# Patient Record
Sex: Female | Born: 1985 | Race: White | Hispanic: No | Smoking: Light tobacco smoker
Health system: Southern US, Community
[De-identification: ages and names within clinical notes are randomized; demographics above are authoritative.]

## PROBLEM LIST (undated history)

## (undated) DIAGNOSIS — Z789 Other specified health status: Secondary | ICD-10-CM

## (undated) DIAGNOSIS — N921 Excessive and frequent menstruation with irregular cycle: Secondary | ICD-10-CM

## (undated) DIAGNOSIS — R87629 Unspecified abnormal cytological findings in specimens from vagina: Secondary | ICD-10-CM

## (undated) DIAGNOSIS — R102 Pelvic and perineal pain: Secondary | ICD-10-CM

## (undated) DIAGNOSIS — Q615 Medullary cystic kidney: Secondary | ICD-10-CM

## (undated) DIAGNOSIS — N200 Calculus of kidney: Secondary | ICD-10-CM

## (undated) DIAGNOSIS — Z309 Encounter for contraceptive management, unspecified: Secondary | ICD-10-CM

## (undated) DIAGNOSIS — B977 Papillomavirus as the cause of diseases classified elsewhere: Secondary | ICD-10-CM

## (undated) DIAGNOSIS — I10 Essential (primary) hypertension: Secondary | ICD-10-CM

## (undated) DIAGNOSIS — Z8619 Personal history of other infectious and parasitic diseases: Secondary | ICD-10-CM

## (undated) DIAGNOSIS — G43909 Migraine, unspecified, not intractable, without status migrainosus: Secondary | ICD-10-CM

## (undated) DIAGNOSIS — K5900 Constipation, unspecified: Secondary | ICD-10-CM

## (undated) HISTORY — DX: Calculus of kidney: N20.0

## (undated) HISTORY — DX: Other specified health status: Z78.9

## (undated) HISTORY — DX: Personal history of other infectious and parasitic diseases: Z86.19

## (undated) HISTORY — DX: Pelvic and perineal pain: R10.2

## (undated) HISTORY — DX: Migraine, unspecified, not intractable, without status migrainosus: G43.909

## (undated) HISTORY — DX: Constipation, unspecified: K59.00

## (undated) HISTORY — DX: Unspecified abnormal cytological findings in specimens from vagina: R87.629

## (undated) HISTORY — DX: Excessive and frequent menstruation with irregular cycle: N92.1

## (undated) HISTORY — DX: Encounter for contraceptive management, unspecified: Z30.9

## (undated) HISTORY — DX: Papillomavirus as the cause of diseases classified elsewhere: B97.7

## (undated) HISTORY — DX: Medullary cystic kidney: Q61.5

## (undated) HISTORY — PX: APPENDECTOMY: SHX54

---
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
## 2005-02-21 ENCOUNTER — Inpatient Hospital Stay (HOSPITAL_COMMUNITY): Admission: RE | Admit: 2005-02-21 | Discharge: 2005-02-24 | Payer: Self-pay | Admitting: Psychiatry

## 2005-02-21 ENCOUNTER — Ambulatory Visit: Payer: Self-pay | Admitting: Psychiatry

## 2005-04-26 ENCOUNTER — Other Ambulatory Visit: Admission: RE | Admit: 2005-04-26 | Discharge: 2005-04-26 | Payer: Self-pay | Admitting: Gynecology

## 2006-03-22 ENCOUNTER — Other Ambulatory Visit: Admission: RE | Admit: 2006-03-22 | Discharge: 2006-03-22 | Payer: Self-pay | Admitting: Gynecology

## 2006-06-16 ENCOUNTER — Emergency Department (HOSPITAL_COMMUNITY): Admission: EM | Admit: 2006-06-16 | Discharge: 2006-06-16 | Payer: Self-pay | Admitting: Emergency Medicine

## 2006-10-07 ENCOUNTER — Other Ambulatory Visit: Admission: RE | Admit: 2006-10-07 | Discharge: 2006-10-07 | Payer: Self-pay | Admitting: Gynecology

## 2007-01-30 ENCOUNTER — Emergency Department (HOSPITAL_COMMUNITY): Admission: EM | Admit: 2007-01-30 | Discharge: 2007-01-30 | Payer: Self-pay | Admitting: Emergency Medicine

## 2009-01-14 ENCOUNTER — Inpatient Hospital Stay (HOSPITAL_COMMUNITY): Admission: AD | Admit: 2009-01-14 | Discharge: 2009-01-20 | Payer: Self-pay | Admitting: Obstetrics

## 2009-11-24 ENCOUNTER — Other Ambulatory Visit: Admission: RE | Admit: 2009-11-24 | Discharge: 2009-11-24 | Payer: Self-pay | Admitting: Obstetrics and Gynecology

## 2010-01-03 ENCOUNTER — Emergency Department (HOSPITAL_COMMUNITY): Admission: EM | Admit: 2010-01-03 | Discharge: 2010-01-03 | Payer: Self-pay | Admitting: Emergency Medicine

## 2010-03-27 ENCOUNTER — Inpatient Hospital Stay (HOSPITAL_COMMUNITY)
Admission: AD | Admit: 2010-03-27 | Discharge: 2010-03-31 | Payer: Self-pay | Source: Home / Self Care | Attending: Obstetrics & Gynecology | Admitting: Obstetrics & Gynecology

## 2010-03-31 ENCOUNTER — Ambulatory Visit (HOSPITAL_COMMUNITY): Admission: RE | Admit: 2010-03-31 | Payer: Self-pay | Source: Home / Self Care | Admitting: Obstetrics and Gynecology

## 2010-06-26 LAB — CBC
HCT: 26.4 % — ABNORMAL LOW (ref 36.0–46.0)
Hemoglobin: 8.4 g/dL — ABNORMAL LOW (ref 12.0–15.0)
MCH: 26.8 pg (ref 26.0–34.0)
MCHC: 31.8 g/dL (ref 30.0–36.0)
MCV: 84.3 fL (ref 78.0–100.0)
Platelets: 246 10*3/uL (ref 150–400)
RBC: 3.13 MIL/uL — ABNORMAL LOW (ref 3.87–5.11)
RDW: 13.5 % (ref 11.5–15.5)
WBC: 9.6 10*3/uL (ref 4.0–10.5)

## 2010-06-26 LAB — COMPREHENSIVE METABOLIC PANEL
ALT: 10 U/L (ref 0–35)
AST: 18 U/L (ref 0–37)
Albumin: 2.6 g/dL — ABNORMAL LOW (ref 3.5–5.2)
Alkaline Phosphatase: 106 U/L (ref 39–117)
BUN: 6 mg/dL (ref 6–23)
CO2: 27 mEq/L (ref 19–32)
Calcium: 8.6 mg/dL (ref 8.4–10.5)
Chloride: 104 mEq/L (ref 96–112)
Creatinine, Ser: 0.71 mg/dL (ref 0.4–1.2)
GFR calc Af Amer: >60 mL/min (ref >60)
GFR calc non Af Amer: >60 mL/min (ref >60)
Glucose, Bld: 81 mg/dL (ref 70–99)
Potassium: 4.4 mEq/L (ref 3.5–5.1)
Sodium: 138 mEq/L (ref 135–145)
Total Bilirubin: 0.6 mg/dL (ref 0.3–1.2)
Total Protein: 5.7 g/dL — ABNORMAL LOW (ref 6.0–8.3)

## 2010-06-26 LAB — URINALYSIS, MICROSCOPIC ONLY
Bilirubin Urine: NEGATIVE
Glucose, UA: NEGATIVE mg/dL
Ketones, ur: NEGATIVE mg/dL
Leukocytes, UA: NEGATIVE
Nitrite: NEGATIVE
Protein, ur: NEGATIVE mg/dL
Specific Gravity, Urine: 1.02 (ref 1.005–1.030)
Urobilinogen, UA: 1 mg/dL (ref 0.0–1.0)
pH: 7 (ref 5.0–8.0)

## 2010-06-26 LAB — PROTEIN / CREATININE RATIO, URINE
Creatinine, Urine: 175.8 mg/dL
Protein Creatinine Ratio: 0.13 (ref 0.00–0.15)
Total Protein, Urine: 22 mg/dL

## 2010-06-26 LAB — RAPID URINE DRUG SCREEN, HOSP PERFORMED
Amphetamines: NOT DETECTED
Barbiturates: NOT DETECTED
Benzodiazepines: NOT DETECTED
Cocaine: NOT DETECTED
Opiates: POSITIVE — AB
Tetrahydrocannabinol: NOT DETECTED

## 2010-06-27 LAB — CBC
HCT: 23.8 % — ABNORMAL LOW (ref 36.0–46.0)
HCT: 29.3 % — ABNORMAL LOW (ref 36.0–46.0)
Hemoglobin: 7.9 g/dL — ABNORMAL LOW (ref 12.0–15.0)
Hemoglobin: 9.7 g/dL — ABNORMAL LOW (ref 12.0–15.0)
MCH: 28.3 pg (ref 26.0–34.0)
MCH: 28.4 pg (ref 26.0–34.0)
MCHC: 33.1 g/dL (ref 30.0–36.0)
MCHC: 33.2 g/dL (ref 30.0–36.0)
MCV: 85.5 fL (ref 78.0–100.0)
MCV: 85.7 fL (ref 78.0–100.0)
Platelets: 197 10*3/uL (ref 150–400)
Platelets: 231 10*3/uL (ref 150–400)
RBC: 2.78 MIL/uL — ABNORMAL LOW (ref 3.87–5.11)
RBC: 3.43 MIL/uL — ABNORMAL LOW (ref 3.87–5.11)
RDW: 13.1 % (ref 11.5–15.5)
RDW: 13.5 % (ref 11.5–15.5)
WBC: 10.1 10*3/uL (ref 4.0–10.5)
WBC: 12 10*3/uL — ABNORMAL HIGH (ref 4.0–10.5)

## 2010-06-27 LAB — URINALYSIS, ROUTINE W REFLEX MICROSCOPIC
Bilirubin Urine: NEGATIVE
Glucose, UA: NEGATIVE mg/dL
Hgb urine dipstick: NEGATIVE
Ketones, ur: 15 mg/dL — AB
Nitrite: NEGATIVE
Protein, ur: NEGATIVE mg/dL
Specific Gravity, Urine: 1.015 (ref 1.005–1.030)
Urobilinogen, UA: 1 mg/dL (ref 0.0–1.0)
pH: 6 (ref 5.0–8.0)

## 2010-06-27 LAB — COMPREHENSIVE METABOLIC PANEL
ALT: 8 U/L (ref 0–35)
AST: 16 U/L (ref 0–37)
Albumin: 2.8 g/dL — ABNORMAL LOW (ref 3.5–5.2)
Alkaline Phosphatase: 124 U/L — ABNORMAL HIGH (ref 39–117)
BUN: 3 mg/dL — ABNORMAL LOW (ref 6–23)
CO2: 24 mEq/L (ref 19–32)
Calcium: 9 mg/dL (ref 8.4–10.5)
Chloride: 104 mEq/L (ref 96–112)
Creatinine, Ser: 0.65 mg/dL (ref 0.4–1.2)
GFR calc Af Amer: >60 mL/min (ref >60)
GFR calc non Af Amer: >60 mL/min (ref >60)
Glucose, Bld: 80 mg/dL (ref 70–99)
Potassium: 3.6 mEq/L (ref 3.5–5.1)
Sodium: 137 mEq/L (ref 135–145)
Total Bilirubin: 0.5 mg/dL (ref 0.3–1.2)
Total Protein: 5.5 g/dL — ABNORMAL LOW (ref 6.0–8.3)

## 2010-06-27 LAB — RAPID URINE DRUG SCREEN, HOSP PERFORMED
Amphetamines: NOT DETECTED
Barbiturates: NOT DETECTED
Benzodiazepines: NOT DETECTED
Cocaine: NOT DETECTED
Opiates: NOT DETECTED
Tetrahydrocannabinol: NOT DETECTED

## 2010-06-27 LAB — RPR: RPR Ser Ql: NONREACTIVE

## 2010-06-27 LAB — PROTEIN / CREATININE RATIO, URINE
Creatinine, Urine: 131.8 mg/dL
Protein Creatinine Ratio: 0.2 — ABNORMAL HIGH (ref 0.00–0.15)
Total Protein, Urine: 26 mg/dL

## 2010-06-27 LAB — URIC ACID: Uric Acid, Serum: 4.8 mg/dL (ref 2.4–7.0)

## 2010-07-20 LAB — COMPREHENSIVE METABOLIC PANEL
ALT: 12 U/L (ref 0–35)
ALT: 12 U/L (ref 0–35)
ALT: 12 U/L (ref 0–35)
ALT: 29 U/L (ref 0–35)
ALT: 37 U/L — ABNORMAL HIGH (ref 0–35)
ALT: 38 U/L — ABNORMAL HIGH (ref 0–35)
AST: 20 U/L (ref 0–37)
AST: 23 U/L (ref 0–37)
AST: 37 U/L (ref 0–37)
AST: 44 U/L — ABNORMAL HIGH (ref 0–37)
Albumin: 2.3 g/dL — ABNORMAL LOW (ref 3.5–5.2)
Albumin: 2.4 g/dL — ABNORMAL LOW (ref 3.5–5.2)
Albumin: 2.6 g/dL — ABNORMAL LOW (ref 3.5–5.2)
Albumin: 2.7 g/dL — ABNORMAL LOW (ref 3.5–5.2)
Alkaline Phosphatase: 105 U/L (ref 39–117)
Alkaline Phosphatase: 119 U/L — ABNORMAL HIGH (ref 39–117)
Alkaline Phosphatase: 145 U/L — ABNORMAL HIGH (ref 39–117)
Alkaline Phosphatase: 92 U/L (ref 39–117)
Alkaline Phosphatase: 99 U/L (ref 39–117)
BUN: 4 mg/dL — ABNORMAL LOW (ref 6–23)
BUN: <1 mg/dL — ABNORMAL LOW (ref 6–23)
BUN: <1 mg/dL — ABNORMAL LOW (ref 6–23)
CO2: 25 mEq/L (ref 19–32)
CO2: 26 mEq/L (ref 19–32)
CO2: 28 mEq/L (ref 19–32)
CO2: 29 mEq/L (ref 19–32)
CO2: 31 mEq/L (ref 19–32)
CO2: 32 mEq/L (ref 19–32)
Calcium: 8.2 mg/dL — ABNORMAL LOW (ref 8.4–10.5)
Calcium: 8.3 mg/dL — ABNORMAL LOW (ref 8.4–10.5)
Calcium: 8.4 mg/dL (ref 8.4–10.5)
Calcium: 8.4 mg/dL (ref 8.4–10.5)
Calcium: 8.5 mg/dL (ref 8.4–10.5)
Calcium: 9 mg/dL (ref 8.4–10.5)
Chloride: 104 mEq/L (ref 96–112)
Chloride: 105 mEq/L (ref 96–112)
Chloride: 108 mEq/L (ref 96–112)
Creatinine, Ser: 0.48 mg/dL (ref 0.4–1.2)
Creatinine, Ser: 0.51 mg/dL (ref 0.4–1.2)
Creatinine, Ser: 0.58 mg/dL (ref 0.4–1.2)
Creatinine, Ser: 0.61 mg/dL (ref 0.4–1.2)
Creatinine, Ser: 0.66 mg/dL (ref 0.4–1.2)
GFR calc Af Amer: >60 mL/min (ref >60)
GFR calc Af Amer: >60 mL/min (ref >60)
GFR calc Af Amer: >60 mL/min (ref >60)
GFR calc Af Amer: >60 mL/min (ref >60)
GFR calc Af Amer: >60 mL/min (ref >60)
GFR calc non Af Amer: >60 mL/min (ref >60)
GFR calc non Af Amer: >60 mL/min (ref >60)
GFR calc non Af Amer: >60 mL/min (ref >60)
GFR calc non Af Amer: >60 mL/min (ref >60)
GFR calc non Af Amer: >60 mL/min (ref >60)
GFR calc non Af Amer: >60 mL/min (ref >60)
Glucose, Bld: 73 mg/dL (ref 70–99)
Glucose, Bld: 79 mg/dL (ref 70–99)
Glucose, Bld: 80 mg/dL (ref 70–99)
Glucose, Bld: 89 mg/dL (ref 70–99)
Glucose, Bld: 97 mg/dL (ref 70–99)
Potassium: 2.5 mEq/L — CL (ref 3.5–5.1)
Potassium: 2.6 mEq/L — CL (ref 3.5–5.1)
Potassium: 3 mEq/L — ABNORMAL LOW (ref 3.5–5.1)
Potassium: 3 mEq/L — ABNORMAL LOW (ref 3.5–5.1)
Potassium: 3.7 mEq/L (ref 3.5–5.1)
Sodium: 137 mEq/L (ref 135–145)
Sodium: 137 mEq/L (ref 135–145)
Sodium: 138 mEq/L (ref 135–145)
Sodium: 139 mEq/L (ref 135–145)
Sodium: 139 mEq/L (ref 135–145)
Sodium: 140 mEq/L (ref 135–145)
Total Bilirubin: 0.6 mg/dL (ref 0.3–1.2)
Total Bilirubin: 0.6 mg/dL (ref 0.3–1.2)
Total Bilirubin: 0.7 mg/dL (ref 0.3–1.2)
Total Bilirubin: 0.7 mg/dL (ref 0.3–1.2)
Total Protein: 3.9 g/dL — ABNORMAL LOW (ref 6.0–8.3)
Total Protein: 4.6 g/dL — ABNORMAL LOW (ref 6.0–8.3)
Total Protein: 5.1 g/dL — ABNORMAL LOW (ref 6.0–8.3)
Total Protein: 5.5 g/dL — ABNORMAL LOW (ref 6.0–8.3)

## 2010-07-20 LAB — CBC
HCT: 26.4 % — ABNORMAL LOW (ref 36.0–46.0)
HCT: 30.6 % — ABNORMAL LOW (ref 36.0–46.0)
HCT: 34.5 % — ABNORMAL LOW (ref 36.0–46.0)
Hemoglobin: 10 g/dL — ABNORMAL LOW (ref 12.0–15.0)
Hemoglobin: 10.4 g/dL — ABNORMAL LOW (ref 12.0–15.0)
Hemoglobin: 11.4 g/dL — ABNORMAL LOW (ref 12.0–15.0)
Hemoglobin: 8.6 g/dL — ABNORMAL LOW (ref 12.0–15.0)
Hemoglobin: 8.7 g/dL — ABNORMAL LOW (ref 12.0–15.0)
Hemoglobin: 9.2 g/dL — ABNORMAL LOW (ref 12.0–15.0)
MCHC: 33 g/dL (ref 30.0–36.0)
MCHC: 33 g/dL (ref 30.0–36.0)
MCHC: 33.3 g/dL (ref 30.0–36.0)
MCHC: 33.5 g/dL (ref 30.0–36.0)
MCHC: 33.8 g/dL (ref 30.0–36.0)
MCV: 87.8 fL (ref 78.0–100.0)
MCV: 88.7 fL (ref 78.0–100.0)
MCV: 88.7 fL (ref 78.0–100.0)
MCV: 89.7 fL (ref 78.0–100.0)
MCV: 89.9 fL (ref 78.0–100.0)
Platelets: 227 10*3/uL (ref 150–400)
Platelets: 246 10*3/uL (ref 150–400)
Platelets: 249 10*3/uL (ref 150–400)
RBC: 2.88 MIL/uL — ABNORMAL LOW (ref 3.87–5.11)
RBC: 2.94 MIL/uL — ABNORMAL LOW (ref 3.87–5.11)
RBC: 3.11 MIL/uL — ABNORMAL LOW (ref 3.87–5.11)
RBC: 3.35 MIL/uL — ABNORMAL LOW (ref 3.87–5.11)
RBC: 3.48 MIL/uL — ABNORMAL LOW (ref 3.87–5.11)
RBC: 3.89 MIL/uL (ref 3.87–5.11)
RDW: 14.2 % (ref 11.5–15.5)
RDW: 14.3 % (ref 11.5–15.5)
RDW: 14.3 % (ref 11.5–15.5)
RDW: 14.5 % (ref 11.5–15.5)
WBC: 10.1 10*3/uL (ref 4.0–10.5)
WBC: 10.2 10*3/uL (ref 4.0–10.5)
WBC: 20.1 10*3/uL — ABNORMAL HIGH (ref 4.0–10.5)
WBC: 8.4 10*3/uL (ref 4.0–10.5)
WBC: 8.8 10*3/uL (ref 4.0–10.5)

## 2010-07-20 LAB — LACTATE DEHYDROGENASE
LDH: 123 U/L (ref 94–250)
LDH: 157 U/L (ref 94–250)
LDH: 165 U/L (ref 94–250)
LDH: 243 U/L (ref 94–250)

## 2010-07-20 LAB — RPR: RPR Ser Ql: NONREACTIVE

## 2010-07-20 LAB — URIC ACID
Uric Acid, Serum: 4.5 mg/dL (ref 2.4–7.0)
Uric Acid, Serum: 4.5 mg/dL (ref 2.4–7.0)
Uric Acid, Serum: 5 mg/dL (ref 2.4–7.0)

## 2010-09-01 NOTE — Op Note (Signed)
   NAMECharleston Strickland                             ACCOUNT NO.:  [id number]   MEDICAL RECORD NO.:  [id number]                   PATIENT TYPE:  OBV   LOCATION:  6123                                 FACILITY:  [facility]   PHYSICIAN:  Thornton Park. Daphine Deutscher, M.D.             DATE OF BIRTH:  04/30/1985   DATE OF PROCEDURE:  01/12/2003  DATE OF DISCHARGE:                                 OPERATIVE REPORT   PREOPERATIVE DIAGNOSIS:  Acute appendicitis.   POSTOPERATIVE DIAGNOSIS:  Acute appendicitis.   PROCEDURE:  Laparoscopic appendectomy.   SURGEON:  Thornton Park. Daphine Deutscher, M.D.   ANESTHESIA:  General endotracheal.   DESCRIPTION OF PROCEDURE:  The patient was taken to room #17 on January 12, 2003, and given general anesthesia.  Preoperatively she had agreed to  receive Unasyn.  The abdomen was prepped with Betadine and draped sterilely.  A longitudinal incision was made down to her umbilicus.  Through this a  longitudinal incision was made in the fascia, and the Hasson cannula was  inserted without difficulty.  The 5 mm was placed in the right upper  quadrant, and a 10-11 in the left lower quadrant for the camera.  The  appendix was identified and was somewhat retrocecal and was bound down in  the retroperitoneal tissue.  The Harmonic scalpel was used to mobilize this.  Once mobilized, I isolated the base by first transecting the mesentery of  the appendix with a Harmonic scalpel, and then transected the appendix with  the Endo GIA.  The appendix was placed in a bag and brought out through the  umbilicus.  The appendiceal stalk was irrigated and was not bleeding.  The  remaining portion of the right lower quadrant appeared to look good, and I  looked in the pelvis.  Her uterus did look slightly enlarged for a 25-year-  old, who is on birth control pills.  I repaired the umbilical defect with two simple sutures of #0 Vicryl under  direct vision, with the scope.  I then deflated the abdomen, removed  the  trocars, and injected the wounds with 0.5% Marcaine and closed the skin with  #5-0 Vicryl with Benzoin and Steri-Strips.  The patient seemed to tolerate the procedure well and was taken to the  recovery room in satisfactory condition.                                                Thornton Park Daphine Deutscher, M.D.    MBM/MEDQ  D:  01/12/2003  T:  01/12/2003  Job:  [id number]   cc:   Gloriajean Dell. Andrey Campanile, M.D.  [address]  [place]  [place] [address]  Fax: [phone]

## 2010-09-01 NOTE — H&P (Signed)
NAMECharleston Strickland                 ACCOUNT NO.:  [id number]   MEDICAL RECORD NO.:  [id number]          PATIENT TYPE:  IPS   LOCATION:  0502                          [facility]   PHYSICIAN:  Jeanice Lim, M.D. DATE OF BIRTH:  [birth year]   DATE OF ADMISSION:  02/21/2005  DATE OF DISCHARGE:                         PSYCHIATRIC ADMISSION ASSESSMENT   IDENTIFYING INFORMATION:  This is a voluntary admission to the services of  Dr. Aleatha Borer.  This is a 25 year old single white female.  The patient  reports my problem is cocaine.  According to the admission statement, the  patient has been having relationship issues with her mom.  This is  longstanding.  The patient started using drugs at age 79 to help cope with  this conflict.  At age 36, she started using Xanax and marijuana.  At age  49, she started using cocaine and liquor.  About two years ago, when she  first started using the cocaine, that is when she attempted to cut herself  at one point in time and yesterday she got upset with her mother, stated she  was going to end it all and went looking for a gun that is in her parents'  house.  Her parents stopped her and brought her here for treatment.   PAST PSYCHIATRIC HISTORY:  She has none.  About a year ago, she was  prescribed Wellbutrin as well as Zoloft but she did not want a pill to make  her happy and hence she stopped the medication.   SOCIAL HISTORY:  She has almost a year of college.  She is working part-time  at [employer] place and has had a boyfriend for eight months.  She states  that it is just too much stress for her.   FAMILY HISTORY:  Negative.   ALCOHOL/DRUG HISTORY:  We have already mentioned that.   PRIMARY CARE PHYSICIAN:  [facility].   MEDICAL PROBLEMS:  Many kidney stones.   MEDICATIONS:  None.   ALLERGIES:  None.   PHYSICAL EXAMINATION:  This is a petite, otherwise well-developed, well-  nourished white female in no acute  distress at this time.  She is 62-1/4  inches tall and weighs 105 pounds.  Temperature is 97.6, blood pressure  129/84, 119/85, pulse ranges from 90 to 116, respirations are 20.   LABORATORY DATA:  Her labs are pending.   MENTAL STATUS EXAM:  She is alert.  She is appropriately groomed, dressed  and nourished.  She has normal rate, rhythm and tone.  Her mood is somewhat  depressed and anxious.  Her affect is normal range.  Her thought processes  are clear, rational and goal-oriented.  She wants to get better.  Judgment  and insight are intact.  Concentration and memory are intact.  Intelligence  is at least average.  She denies suicidal or homicidal ideation.  She denies  auditory or visual hallucinations.  She does report a problem with insomnia.  She states that she goes to bed and just cannot initiate sleep.  She will  toss and turn and,  before she knows it, it is 2:30 in the morning.  She has  to get up at 6 a.m. and this creates quite a bit of anxiety for her.   DIAGNOSES:  AXIS I:  Major depressive disorder, single episode.  Polysubstance dependence.  AXIS II:  Deferred.  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  AXIS IV:  Severe.  AXIS V:  30.   PLAN:  To admit for safety and stabilization, to help her find ways to  resist the cocaine and, toward that end, we will restart the Wellbutrin that  she was on before and she had already been written for Ambien to help her  initiate sleep.  A mood disorders questionnaire was administered.  It was  not suggestive for an underlying mood disorder.  The patient indeed denied  that but it was thought best to look at that anyway.      Kristina Strickland, P.A.-C.      Jeanice Lim, M.D.  Electronically Signed    MD/MEDQ  D:  02/21/2005  T:  02/21/2005  Job:  [id number]

## 2010-09-01 NOTE — H&P (Signed)
   NAMECharleston Strickland                             ACCOUNT NO.:  [id number]   MEDICAL RECORD NO.:  [id number]                   PATIENT TYPE:  EMS   LOCATION:  MAJO                                 FACILITY:  [facility]   PHYSICIAN:  Thornton Park. Daphine Deutscher, M.D.             DATE OF BIRTH:  09-Mar-1986   DATE OF ADMISSION:  01/11/2003  DATE OF DISCHARGE:                                HISTORY & PHYSICAL   CHIEF COMPLAINT:  Abdominal pain localized in the right lower quadrant.   HISTORY:  This is a 25 year old white female who began having pain yesterday  afternoon.  This began as generalized abdominal pain which began to  localize.  She was seen in the emergency room early in the morning of  September 28,2004 and evaluation included a CT scan which showed  appendicitis.   PAST MEDICAL HISTORY:  The patient takes no medications except birth control  pills.  No prior surgery or medical admissions.   ALLERGIES:  No known allergies.   SMOKING HISTORY:  The patient does smoke a half-pack of cigarettes per day.   FAMILY HISTORY:  Noncontributory.   REVIEW OF SYSTEMS:  Negative for neurologic, pulmonary (asthma),  cardiovascular, or GI problems.   SOCIAL HISTORY:  She is 25 years old and is accompanied by her mother.   PHYSICAL EXAMINATION:  GENERAL: A pleasant white female with apparent normal  weight and development.  VITAL SIGNS: Temperature 98.6, pulse 94, respirations 20, blood pressure  132/92.  HEENT: Head normocephalic.  Eyes - sclerae are non icteric.  Pupils are  equal, round and reactive to light.  Nose and throat exam unremarkable.  NECK: Supple without masses or adenopathy.  CHEST: Clear to auscultation.  HEART: Sinus rhythm without murmurs or gallops.  ABDOMEN: She has an [place] tattoo in the right lower quadrant and  tenderness at McBurney's point.  She has a hole in her navel from navel  piercing but no ornament.  EXTREMITIES: Exam shows full range of motion.  SKIN:  Unremarkable.   LABORATORY DATA:  White count elevated at 11,000.  CT scan was reviewed by  me and is consistent with appendicitis.   I have discussed the procedure, laparoscopy as well as open appendectomy  with her and her mother.  I have asked to proceed in the operating room as  soon as an operating room is available.                                                 Thornton Park Daphine Deutscher, M.D.    MBM/MEDQ  D:  01/12/2003  T:  01/12/2003  Job:  [id number]

## 2010-09-01 NOTE — Discharge Summary (Signed)
NAMECharleston Strickland                 ACCOUNT NO.:  [id number]   MEDICAL RECORD NO.:  [id number]          PATIENT TYPE:  IPS   LOCATION:  0502                          [facility]   PHYSICIAN:  Jeanice Lim, M.D. DATE OF BIRTH:  [birth year]   DATE OF ADMISSION:  02/21/2005  DATE OF DISCHARGE:  02/24/2005                                 DISCHARGE SUMMARY   IDENTIFYING DATA:  This is a voluntary admission for this 25 year old single  Caucasian female.  Reporting my problem is cocaine.  According to the  admission statement, the patient had been having relationship issues with  mom, longstanding.  Started using drugs at 16 to help with this conflict.  Began Xanax and marijuana at age 89 and using cocaine and liquor about for  two years now.  Increasing cocaine use.  Had cut herself at one point in  time and yesterday got upset with mother and stated that she was gonna end  it all.  Went looking for a gun that was in her parents' house.  Parents  stopped her and brought her here for treatment.   PAST PSYCHIATRIC HISTORY:  She had been here about a year ago.  Was  prescribed Wellbutrin and Zoloft but did not want a pill to make her happy  and stopped the medications.   MEDICATIONS:  None.   ALLERGIES:  No known drug allergies.   PHYSICAL EXAMINATION:  Physical and neurologic exam essentially within  normal limits.  Petite, well-developed, well-nourished female, 62-1/4 inches  tall and weighing 105 pounds.  Vital signs stable.  Afebrile.   LABORATORY DATA:  Routine admission labs essentially within normal limits.   MENTAL STATUS EXAM:  Alert, appropriately groomed, dressed, nourished.  Speech within normal limits.  Mood depressed and anxious.  Thought process  mostly goal directed.  Wants to get better and wants to get her life more  healthy.  Cognition was intact.  Denied suicidal or homicidal ideation or  psychotic symptoms.  Reported episodic problem with insomnia and reports  that she cannot fall asleep and will toss and turn.  Before she knows it, it  is 2:30 in the morning and has to get up at 6 a.m. which creates anxiety for  her.  Cognition intact.  Judgment and insight impaired.   ADMISSION DIAGNOSES:  AXIS I:  Major depressive disorder, single episode,  moderate.  Polysubstance abuse.  Cocaine dependence.  History of  benzodiazepines dependence.  Cannabis dependence.  Rule out substance-  induced mood disorder superimposed on depressive disorder not otherwise  specified.  AXIS II:  Deferred.  AXIS III:  Sponge kidney by history, apparently has more kidney stones than  average.  AXIS IV:  Severe.  AXIS V:  30/55-60.   HOSPITAL COURSE:  The patient was admitted and ordered routine p.r.n.  medications and underwent further monitoring.  Was encouraged to participate  in individual, group and milieu therapy.  The patient worked on relapse  prevention plan and monitored for safety.  Stabilized on medications with  education regarding the importance of treating mood which she  does appear to  have depressive symptoms and anxiety as well as sleep issues and she was  agreeable after medication education to start Zoloft.  Family meeting was  held to mobilize support system and patient then decided not to take Zoloft  but she did not have a clear depression that required treatment and did  agree to be set up with a therapist and psychiatrist in [place] or  [place].  She did require Ambien for sleep and reported a positive  response to inpatient stabilization.   CONDITION ON DISCHARGE:  Improved.  Mood was more euthymic.  Affect  brighter.  Did tolerate Wellbutrin for depression and was willing to take  this and was discharged after medication education.   DISCHARGE MEDICATIONS:  1.  Wellbutrin XL 150 mg q.a.m.  2.  Ambien 10 mg q.h.s. p.r.n.   FOLLOW UP:  To follow up with Dr. Omelia Blackwater in [facility] in  [place] and with therapist and  first appointment was on March 06, 2005  at 5:30 p.m.   DISCHARGE DIAGNOSES:  AXIS I:  Major depressive disorder, single episode,  moderate.  Polysubstance abuse.  Cocaine dependence.  History of  benzodiazepines dependence.  Cannabis dependence.  Rule out substance-  induced mood disorder superimposed on depressive disorder not otherwise  specified.  AXIS II:  Deferred.  AXIS III:  Sponge kidney by history, apparently has more kidney stones than  average.  AXIS IV:  Severe.  AXIS V:  GAF on discharge 55.      Jeanice Lim, M.D.  Electronically Signed     JEM/MEDQ  D:  03/05/2005  T:  03/05/2005  Job:  [id number]

## 2013-03-09 ENCOUNTER — Emergency Department (HOSPITAL_COMMUNITY)
Admission: EM | Admit: 2013-03-09 | Discharge: 2013-03-09 | Disposition: A | Discharge status: Home / Self Care | Payer: Self-pay | Attending: Emergency Medicine | Admitting: Emergency Medicine

## 2013-03-09 ENCOUNTER — Encounter (HOSPITAL_COMMUNITY): Payer: Self-pay | Admitting: Emergency Medicine

## 2013-03-09 DIAGNOSIS — M436 Torticollis: Secondary | ICD-10-CM | POA: Insufficient documentation

## 2013-03-09 DIAGNOSIS — F17200 Nicotine dependence, unspecified, uncomplicated: Secondary | ICD-10-CM | POA: Insufficient documentation

## 2013-03-09 DIAGNOSIS — I10 Essential (primary) hypertension: Secondary | ICD-10-CM | POA: Insufficient documentation

## 2013-03-09 DIAGNOSIS — Z880 Allergy status to penicillin: Secondary | ICD-10-CM | POA: Insufficient documentation

## 2013-03-09 HISTORY — DX: Essential (primary) hypertension: I10

## 2013-03-09 MED ORDER — CYCLOBENZAPRINE HCL 10 MG PO TABS: 10.0000 mg | ORAL_TABLET | Freq: Two times a day (BID) | ORAL | Status: DC | PRN

## 2013-03-09 MED ORDER — IBUPROFEN 600 MG PO TABS: 600.0000 mg | ORAL_TABLET | Freq: Four times a day (QID) | ORAL | Status: DC | PRN

## 2013-03-09 MED ORDER — CYCLOBENZAPRINE HCL 10 MG PO TABS
10.0000 mg | ORAL_TABLET | Freq: Once | ORAL | Status: AC
  Administered 2013-03-09: 10 mg via ORAL
  Filled 2013-03-09: qty 1

## 2013-03-09 MED ORDER — KETOROLAC TROMETHAMINE 60 MG/2ML IM SOLN
60.0000 mg | Freq: Once | INTRAMUSCULAR | Status: AC
Start: 2013-03-09 — End: 2013-03-09
  Administered 2013-03-09: 60 mg via INTRAMUSCULAR
  Filled 2013-03-09: qty 2

## 2013-03-09 NOTE — ED Notes (Signed)
Pt c/o pain in left side of neck that radiates into left shoulder since Friday evening.  Reports pain has been progressively getting worse.  Reports pain is worse with movement.  Denies injury.

## 2013-03-09 NOTE — ED Provider Notes (Signed)
CSN: [id number]     Arrival date & time 03/09/13  0935 History   First MD Initiated Contact with Patient 03/09/13 [phone]     Chief Complaint  Patient presents with  . Neck Pain   (Consider location/radiation/quality/duration/timing/severity/associated sxs/prior Treatment) The history is provided by the patient.  Kristina Strickland is a 27 y.o. female who presents to the ED with left side neck pain. The pain started a little 3 nights ago. When she got up the next morning the pain was really bad at the base of her neck on the left side and into the top part of the shoulder. The pain is worse with any movement. The pain is moderate until she moves her neck and then it is severe. She thinks she may have taken ibuprofen once since the pain started. She has done nothing to try and relieve the pain. She denies chest pain, nausea, vomiting, shortness of breath or other problems.   Past Medical History  Diagnosis Date  . Hypertension    Past Surgical History  Procedure Laterality Date  . Appendectomy     No family history on file. History  Substance Use Topics  . Smoking status: Current Some Day Smoker  . Smokeless tobacco: Not on file  . Alcohol Use: Yes     Comment: occ   OB History   Grav Para Term Preterm Abortions TAB SAB Ect Mult Living                 Review of Systems Negative except as stated in HPI.   Allergies  Penicillins  Home Medications  No current outpatient prescriptions on file. BP 141/96  Pulse 77  Temp(Src) 98.5 F (36.9 C) (Oral)  Resp 18  Ht 5\' 3"  (1.6 m)  Wt 173 lb (78.472 kg)  BMI 30.65 kg/m2  SpO2 99% Physical Exam  Nursing note and vitals reviewed. Constitutional: She is oriented to person, place, and time. She appears well-developed and well-nourished. No distress.  HENT:  Head: Normocephalic.  Eyes: EOM are normal.  Neck: Trachea normal. Neck supple. No JVD present. Muscular tenderness present. No rigidity. No erythema present.    Cardiovascular:  Normal rate, regular rhythm and normal heart sounds.   Pulmonary/Chest: Effort normal and breath sounds normal.  Musculoskeletal: Normal range of motion.  Radial pulses equal, adequate circulation, good touch sensation. Grips equal and strong.  Neurological: She is alert and oriented to person, place, and time. No cranial nerve deficit.  Skin: Skin is warm and dry.  Psychiatric: She has a normal mood and affect. Her behavior is normal.    ED Course  Procedures  Treated with Flexeril and Toradol Here in the ED. Patient had good results. Will plan to continue treatment on discharge with muscle relaxant and NSAIDS.  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  I have reviewed this patient's vital signs, nurses notes and discussed with the patient clinical findings and plan of care. She voices understanding. She is aware that if her symptoms worsen she may return at any time. Stable for discharge with improved condition at this time.    Medication List         cyclobenzaprine 10 MG tablet  Commonly known as:  FLEXERIL  Take 1 tablet (10 mg total) by mouth 2 (two) times daily as needed for muscle spasms.     ibuprofen 600 MG tablet  Commonly known as:  ADVIL,MOTRIN  Take 1 tablet (600 mg total) by mouth every 6 (six) hours as needed.         Janne Napoleon, [place] 03/09/13 [phone]

## 2013-03-14 ENCOUNTER — Emergency Department (HOSPITAL_COMMUNITY): Payer: Self-pay

## 2013-03-14 ENCOUNTER — Emergency Department (HOSPITAL_COMMUNITY)
Admission: EM | Admit: 2013-03-14 | Discharge: 2013-03-14 | Disposition: A | Discharge status: Home / Self Care | Payer: Self-pay | Attending: Emergency Medicine | Admitting: Emergency Medicine

## 2013-03-14 ENCOUNTER — Encounter (HOSPITAL_COMMUNITY): Payer: Self-pay | Admitting: Emergency Medicine

## 2013-03-14 DIAGNOSIS — F17200 Nicotine dependence, unspecified, uncomplicated: Secondary | ICD-10-CM | POA: Insufficient documentation

## 2013-03-14 DIAGNOSIS — R1084 Generalized abdominal pain: Secondary | ICD-10-CM | POA: Insufficient documentation

## 2013-03-14 DIAGNOSIS — Z3202 Encounter for pregnancy test, result negative: Secondary | ICD-10-CM | POA: Insufficient documentation

## 2013-03-14 DIAGNOSIS — K5900 Constipation, unspecified: Secondary | ICD-10-CM | POA: Insufficient documentation

## 2013-03-14 DIAGNOSIS — I10 Essential (primary) hypertension: Secondary | ICD-10-CM | POA: Insufficient documentation

## 2013-03-14 DIAGNOSIS — Z9089 Acquired absence of other organs: Secondary | ICD-10-CM | POA: Insufficient documentation

## 2013-03-14 DIAGNOSIS — Z79899 Other long term (current) drug therapy: Secondary | ICD-10-CM | POA: Insufficient documentation

## 2013-03-14 DIAGNOSIS — R109 Unspecified abdominal pain: Secondary | ICD-10-CM

## 2013-03-14 DIAGNOSIS — Z880 Allergy status to penicillin: Secondary | ICD-10-CM | POA: Insufficient documentation

## 2013-03-14 LAB — CBC WITH DIFFERENTIAL/PLATELET
Basophils Absolute: 0 10*3/uL (ref 0.0–0.1)
Basophils Relative: 0 % (ref 0–1)
Eosinophils Absolute: 0.2 10*3/uL (ref 0.0–0.7)
Eosinophils Relative: 2 % (ref 0–5)
HCT: 42 % (ref 36.0–46.0)
Hemoglobin: 14 g/dL (ref 12.0–15.0)
Lymphocytes Relative: 21 % (ref 12–46)
Lymphs Abs: 1.7 10*3/uL (ref 0.7–4.0)
MCH: 30.6 pg (ref 26.0–34.0)
MCHC: 33.3 g/dL (ref 30.0–36.0)
MCV: 91.9 fL (ref 78.0–100.0)
Monocytes Absolute: 0.8 10*3/uL (ref 0.1–1.0)
Monocytes Relative: 10 % (ref 3–12)
Neutro Abs: 5.3 10*3/uL (ref 1.7–7.7)
Neutrophils Relative %: 67 % (ref 43–77)
Platelets: 268 10*3/uL (ref 150–400)
RBC: 4.57 MIL/uL (ref 3.87–5.11)
RDW: 13 % (ref 11.5–15.5)
WBC: 8 10*3/uL (ref 4.0–10.5)

## 2013-03-14 LAB — COMPREHENSIVE METABOLIC PANEL
ALT: 11 U/L (ref 0–35)
AST: 15 U/L (ref 0–37)
Albumin: 4.1 g/dL (ref 3.5–5.2)
Alkaline Phosphatase: 54 U/L (ref 39–117)
BUN: 14 mg/dL (ref 6–23)
CO2: 28 mEq/L (ref 19–32)
Calcium: 9.6 mg/dL (ref 8.4–10.5)
Chloride: 100 mEq/L (ref 96–112)
Creatinine, Ser: 0.73 mg/dL (ref 0.50–1.10)
GFR calc Af Amer: >90 mL/min (ref >90)
GFR calc non Af Amer: >90 mL/min (ref >90)
Glucose, Bld: 91 mg/dL (ref 70–99)
Potassium: 3.3 mEq/L — ABNORMAL LOW (ref 3.5–5.1)
Sodium: 138 mEq/L (ref 135–145)
Total Bilirubin: 0.4 mg/dL (ref 0.3–1.2)
Total Protein: 7.5 g/dL (ref 6.0–8.3)

## 2013-03-14 LAB — URINE MICROSCOPIC-ADD ON

## 2013-03-14 LAB — URINALYSIS, ROUTINE W REFLEX MICROSCOPIC
Bilirubin Urine: NEGATIVE
Specific Gravity, Urine: >1.03 — ABNORMAL HIGH (ref 1.005–1.030)
Urobilinogen, UA: 0.2 mg/dL (ref 0.0–1.0)
pH: 6 (ref 5.0–8.0)

## 2013-03-14 LAB — LIPASE, BLOOD: Lipase: 14 U/L (ref 11–59)

## 2013-03-14 MED ORDER — IOHEXOL 300 MG/ML  SOLN
50.0000 mL | Freq: Once | INTRAMUSCULAR | Status: AC | PRN
  Administered 2013-03-14: 50 mL via ORAL

## 2013-03-14 MED ORDER — POTASSIUM CHLORIDE CRYS ER 20 MEQ PO TBCR
40.0000 meq | EXTENDED_RELEASE_TABLET | Freq: Once | ORAL | Status: AC
  Administered 2013-03-14: 40 meq via ORAL
  Filled 2013-03-14: qty 2

## 2013-03-14 MED ORDER — SODIUM CHLORIDE 0.9 % IV SOLN
INTRAVENOUS | Status: DC
  Administered 2013-03-14: 1000 mL via INTRAVENOUS

## 2013-03-14 MED ORDER — FAMOTIDINE IN NACL 20-0.9 MG/50ML-% IV SOLN
20.0000 mg | Freq: Once | INTRAVENOUS | Status: AC
  Administered 2013-03-14: 20 mg via INTRAVENOUS
  Filled 2013-03-14: qty 50

## 2013-03-14 MED ORDER — IOHEXOL 300 MG/ML  SOLN
100.0000 mL | Freq: Once | INTRAMUSCULAR | Status: AC | PRN
  Administered 2013-03-14: 100 mL via INTRAVENOUS

## 2013-03-14 MED ORDER — SODIUM CHLORIDE 0.9 % IJ SOLN
INTRAMUSCULAR | Status: AC
  Filled 2013-03-14: qty 250

## 2013-03-14 MED ORDER — POTASSIUM CHLORIDE 20 MEQ/15ML (10%) PO LIQD: 40.0000 meq | Freq: Once | ORAL | Status: DC

## 2013-03-14 NOTE — ED Notes (Signed)
Pt alert & oriented x4, stable gait. Patient given discharge instructions, paperwork & prescription(s). Patient  instructed to stop at the registration desk to finish any additional paperwork. Patient verbalized understanding. Pt left department w/ no further questions. 

## 2013-03-14 NOTE — ED Provider Notes (Signed)
CSN: [id number]     Arrival date & time 03/14/13  1915 History   First MD Initiated Contact with Patient 03/14/13 1935     Chief Complaint  Patient presents with  . Bloated  . Abdominal Pain  . Nausea    HPI Pt was seen at 1950.  Per pt, c/o gradual onset and persistence of constant generalized abd "pain" for the past 5 days.  Has been associated with "constipation."  Describes the abd pain as "bloating." States she took a laxative today and had a BM without improvement in her symptoms. Denies vomiting/diarrhea, no fevers, no back pain, no rash, no CP/SOB, no black or blood in stools.       Past Medical History  Diagnosis Date  . Hypertension    Past Surgical History  Procedure Laterality Date  . Appendectomy      History  Substance Use Topics  . Smoking status: Current Some Day Smoker  . Smokeless tobacco: Not on file  . Alcohol Use: Yes     Comment: occ    Review of Systems ROS: Statement: All systems negative except as marked or noted in the HPI; Constitutional: Negative for fever and chills. ; ; Eyes: Negative for eye pain, redness and discharge. ; ; ENMT: Negative for ear pain, hoarseness, nasal congestion, sinus pressure and sore throat. ; ; Cardiovascular: Negative for chest pain, palpitations, diaphoresis, dyspnea and peripheral edema. ; ; Respiratory: Negative for cough, wheezing and stridor. ; ; Gastrointestinal: +abd pain, constipation. Negative for nausea, vomiting, diarrhea, blood in stool, hematemesis, jaundice and rectal bleeding. . ; ; Genitourinary: Negative for dysuria, flank pain and hematuria. ; ; Musculoskeletal: Negative for back pain and neck pain. Negative for swelling and trauma.; ; Skin: Negative for pruritus, rash, abrasions, blisters, bruising and skin lesion.; ; Neuro: Negative for headache, lightheadedness and neck stiffness. Negative for weakness, altered level of consciousness , altered mental status, extremity weakness, paresthesias, involuntary  movement, seizure and syncope.      Allergies  Penicillins  Home Medications   Current Outpatient Rx  Name  Route  Sig  Dispense  Refill  . ibuprofen (ADVIL,MOTRIN) 200 MG tablet   Oral   Take 400 mg by mouth every 6 (six) hours as needed for mild pain or moderate pain.         Marland Kitchen ibuprofen (ADVIL,MOTRIN) 600 MG tablet   Oral   Take 1 tablet (600 mg total) by mouth every 6 (six) hours as needed.   30 tablet   0   . lisinopril-hydrochlorothiazide (PRINZIDE,ZESTORETIC) 10-12.5 MG per tablet   Oral   Take 1 tablet by mouth daily.          BP 147/105  Pulse 84  Temp(Src) 98.1 F (36.7 C) (Oral)  Resp 20  Ht 5\' 3"  (1.6 m)  Wt 173 lb (78.472 kg)  BMI 30.65 kg/m2  SpO2 100% Physical Exam 1955: Physical examination:  Nursing notes reviewed; Vital signs and O2 SAT reviewed;  Constitutional: Well developed, Well nourished, Well hydrated, In no acute distress; Head:  Normocephalic, atraumatic; Eyes: EOMI, PERRL, No scleral icterus; ENMT: Mouth and pharynx normal, Mucous membranes moist; Neck: Supple, Full range of motion, No lymphadenopathy; Cardiovascular: Regular rate and rhythm, No gallop; Respiratory: Breath sounds clear & equal bilaterally, No wheezes.  Speaking full sentences with ease, Normal respiratory effort/excursion; Chest: Nontender, Movement normal; Abdomen: Soft, +mild diffuse tenderness to palp. No rebound or guarding. Nondistended, Normal bowel sounds; Genitourinary: No CVA tenderness; Extremities: Pulses normal,  No tenderness, No edema, No calf edema or asymmetry.; Neuro: AA&Ox3, Major CN grossly intact.  Speech clear. Climbs on and off stretcher easily by herself. Gait steady. No gross focal motor or sensory deficits in extremities.; Skin: Color normal, Warm, Dry.   ED Course  Procedures    EKG Interpretation   None       MDM  MDM Reviewed: previous chart, nursing note and vitals Reviewed previous: labs Interpretation: labs and CT scan   Results for  orders placed during the hospital encounter of 03/14/13  PREGNANCY, URINE      Result Value Range   Preg Test, Ur NEGATIVE  NEGATIVE  URINALYSIS, ROUTINE W REFLEX MICROSCOPIC      Result Value Range   Color, Urine YELLOW  YELLOW   APPearance CLEAR  CLEAR   Specific Gravity, Urine >1.030 (*) 1.005 - 1.030   pH 6.0  5.0 - 8.0   Glucose, UA NEGATIVE  NEGATIVE mg/dL   Hgb urine dipstick TRACE (*) NEGATIVE   Bilirubin Urine NEGATIVE  NEGATIVE   Ketones, ur NEGATIVE  NEGATIVE mg/dL   Protein, ur NEGATIVE  NEGATIVE mg/dL   Urobilinogen, UA 0.2  0.0 - 1.0 mg/dL   Nitrite NEGATIVE  NEGATIVE   Leukocytes, UA NEGATIVE  NEGATIVE  URINE MICROSCOPIC-ADD ON      Result Value Range   WBC, UA 0-2  <3 WBC/hpf   RBC / HPF 0-2  <3 RBC/hpf  CBC WITH DIFFERENTIAL      Result Value Range   WBC 8.0  4.0 - 10.5 K/uL   RBC 4.57  3.87 - 5.11 MIL/uL   Hemoglobin 14.0  12.0 - 15.0 g/dL   HCT 82.9  56.2 - 13.0 %   MCV 91.9  78.0 - 100.0 fL   MCH 30.6  26.0 - 34.0 pg   MCHC 33.3  30.0 - 36.0 g/dL   RDW 86.5  78.4 - 69.6 %   Platelets 268  150 - 400 K/uL   Neutrophils Relative % 67  43 - 77 %   Neutro Abs 5.3  1.7 - 7.7 K/uL   Lymphocytes Relative 21  12 - 46 %   Lymphs Abs 1.7  0.7 - 4.0 K/uL   Monocytes Relative 10  3 - 12 %   Monocytes Absolute 0.8  0.1 - 1.0 K/uL   Eosinophils Relative 2  0 - 5 %   Eosinophils Absolute 0.2  0.0 - 0.7 K/uL   Basophils Relative 0  0 - 1 %   Basophils Absolute 0.0  0.0 - 0.1 K/uL  COMPREHENSIVE METABOLIC PANEL      Result Value Range   Sodium 138  135 - 145 mEq/L   Potassium 3.3 (*) 3.5 - 5.1 mEq/L   Chloride 100  96 - 112 mEq/L   CO2 28  19 - 32 mEq/L   Glucose, Bld 91  70 - 99 mg/dL   BUN 14  6 - 23 mg/dL   Creatinine, Ser 2.95  0.50 - 1.10 mg/dL   Calcium 9.6  8.4 - 28.4 mg/dL   Total Protein 7.5  6.0 - 8.3 g/dL   Albumin 4.1  3.5 - 5.2 g/dL   AST 15  0 - 37 U/L   ALT 11  0 - 35 U/L   Alkaline Phosphatase 54  39 - 117 U/L   Total Bilirubin 0.4  0.3 -  1.2 mg/dL   GFR calc non Af Amer >90  >90 mL/min   GFR  calc Af Amer >90  >90 mL/min  LIPASE, BLOOD      Result Value Range   Lipase 14  11 - 59 U/L     2140:  Potassium repleted PO. Pt tol PO well while in the ED without N/V. No stooling while in the ED. Has been ambulatory with steady gait. CT A/P pending. Dispo based on results. Sign out to Dr. Rubin Payor.           Laray Anger, DO 03/14/13 2141

## 2013-03-14 NOTE — ED Notes (Signed)
Pt c/o abdominal pain and bloating since Monday. Pt says she had a BM today after taking 3 Correctol. Last BM was 5 days ago. Pt also c/o nausea.

## 2013-03-16 NOTE — ED Provider Notes (Signed)
Medical screening examination/treatment/procedure(s) were performed by non-physician practitioner and as supervising physician I was immediately available for consultation/collaboration.  EKG Interpretation   None         Devera Englander L Shylah Dossantos, MD 03/16/13 0702 

## 2013-07-02 ENCOUNTER — Other Ambulatory Visit (HOSPITAL_COMMUNITY): Payer: Self-pay | Admitting: Nurse Practitioner

## 2013-07-02 DIAGNOSIS — T8332XA Displacement of intrauterine contraceptive device, initial encounter: Secondary | ICD-10-CM

## 2013-07-09 ENCOUNTER — Other Ambulatory Visit (HOSPITAL_COMMUNITY): Payer: Self-pay | Admitting: Nurse Practitioner

## 2013-07-09 ENCOUNTER — Ambulatory Visit (HOSPITAL_COMMUNITY)
Admission: RE | Admit: 2013-07-09 | Discharge: 2013-07-09 | Disposition: A | Discharge status: Home / Self Care | Payer: Self-pay | Source: Ambulatory Visit | Attending: Nurse Practitioner | Admitting: Nurse Practitioner

## 2013-07-09 DIAGNOSIS — T8332XA Displacement of intrauterine contraceptive device, initial encounter: Secondary | ICD-10-CM

## 2013-07-09 DIAGNOSIS — Z30431 Encounter for routine checking of intrauterine contraceptive device: Secondary | ICD-10-CM | POA: Insufficient documentation

## 2013-07-09 DIAGNOSIS — N72 Inflammatory disease of cervix uteri: Secondary | ICD-10-CM | POA: Insufficient documentation

## 2013-07-30 ENCOUNTER — Encounter: Payer: Self-pay | Admitting: *Deleted

## 2013-07-31 ENCOUNTER — Ambulatory Visit: Payer: Self-pay | Admitting: Obstetrics and Gynecology

## 2013-08-11 ENCOUNTER — Encounter: Payer: Self-pay | Admitting: Adult Health

## 2013-08-11 ENCOUNTER — Ambulatory Visit (INDEPENDENT_AMBULATORY_CARE_PROVIDER_SITE_OTHER): Payer: Self-pay | Admitting: Adult Health

## 2013-08-11 VITALS — BP 120/74 | Ht 63.0 in | Wt 173.0 lb

## 2013-08-11 DIAGNOSIS — Z309 Encounter for contraceptive management, unspecified: Secondary | ICD-10-CM

## 2013-08-11 DIAGNOSIS — Z3200 Encounter for pregnancy test, result unknown: Secondary | ICD-10-CM

## 2013-08-11 DIAGNOSIS — Z3202 Encounter for pregnancy test, result negative: Secondary | ICD-10-CM

## 2013-08-11 DIAGNOSIS — Z30432 Encounter for removal of intrauterine contraceptive device: Secondary | ICD-10-CM

## 2013-08-11 HISTORY — DX: Encounter for contraceptive management, unspecified: Z30.9

## 2013-08-11 LAB — POCT URINE PREGNANCY: Preg Test, Ur: NEGATIVE

## 2013-08-11 MED ORDER — NORGESTIM-ETH ESTRAD TRIPHASIC 0.18/0.215/0.25 MG-35 MCG PO TABS: 1.0000 | ORAL_TABLET | Freq: Every day | ORAL | Status: DC

## 2013-08-11 NOTE — Progress Notes (Signed)
Subjective:     Patient ID: Kristina Strickland, female   DOB: 1985-05-04, 28 y.o.   MRN: [id number]  HPI Kristina Strickland is a 29 year old white female in for IUD removal.She said she has gained 40 lbs.She wants the pill now.  Review of Systems See HPI Reviewed past medical,surgical, social and family history. Reviewed medications and allergies.     Objective:   Physical Exam BP 120/74  Ht 5\' 3"  (1.6 m)  Wt 173 lb (78.472 kg)  BMI 30.65 kg/m2UPT negative, IUD strings not visible, used needle nose forceps to remove IUD without difficulty.Will start OCs today, has migraine without aura.    Assessment:     IUD removal Contraceptive management    Plan:       Rx tri sprintec, disp 1 pack take 1 daily, start today refilled x 1 year Use condoms x 1 month Return in 3 months for pap and physical.

## 2013-08-11 NOTE — Patient Instructions (Signed)
Oral Contraception Use Oral contraceptive pills (OCPs) are medicines taken to prevent pregnancy. OCPs work by preventing the ovaries from releasing eggs. The hormones in OCPs also cause the cervical mucus to thicken, preventing the sperm from entering the uterus. The hormones also cause the uterine lining to become thin, not allowing a fertilized egg to attach to the inside of the uterus. OCPs are highly effective when taken exactly as prescribed. However, OCPs do not prevent sexually transmitted diseases (STDs). Safe sex practices, such as using condoms along with an OCP, can help prevent STDs. Before taking OCPs, you may have a physical exam and Pap test. Your health care provider may also order blood tests if necessary. Your health care provider will make sure you are a good candidate for oral contraception. Discuss with your health care provider the possible side effects of the OCP you may be prescribed. When starting an OCP, it can take 2 to 3 months for the body to adjust to the changes in hormone levels in your body.  HOW TO TAKE ORAL CONTRACEPTIVE PILLS Your health care provider may advise you on how to start taking the first cycle of OCPs. Otherwise, you can:   Start on day 1 of your menstrual period. You will not need any backup contraceptive protection with this start time.   Start on the first Sunday after your menstrual period or the day you get your prescription. In these cases, you will need to use backup contraceptive protection for the first week.   Start the pill at any time of your cycle. If you take the pill within 5 days of the start of your period, you are protected against pregnancy right away. In this case, you will not need a backup form of birth control. If you start at any other time of your menstrual cycle, you will need to use another form of birth control for 7 days. If your OCP is the type called a minipill, it will protect you from pregnancy after taking it for 2 days (48  hours). After you have started taking OCPs:   If you forget to take 1 pill, take it as soon as you remember. Take the next pill at the regular time.   If you miss 2 or more pills, call your health care provider because different pills have different instructions for missed doses. Use backup birth control until your next menstrual period starts.   If you use a 28-day pack that contains inactive pills and you miss 1 of the last 7 pills (pills with no hormones), it will not matter. Throw away the rest of the nonhormone pills and start a new pill pack.  No matter which day you start the OCP, you will always start a new pack on that same day of the week. Have an extra pack of OCPs and a backup contraceptive method available in case you miss some pills or lose your OCP pack.  HOME CARE INSTRUCTIONS   Do not smoke.   Always use a condom to protect against STDs. OCPs do not protect against STDs.   Use a calendar to mark your menstrual period days.   Read the information and directions that came with your OCP. Talk to your health care provider if you have questions.  SEEK MEDICAL CARE IF:   You develop nausea and vomiting.   You have abnormal vaginal discharge or bleeding.   You develop a rash.   You miss your menstrual period.   You are losing   your hair.   You need treatment for mood swings or depression.   You get dizzy when taking the OCP.   You develop acne from taking the OCP.   You become pregnant.  SEEK IMMEDIATE MEDICAL CARE IF:   You develop chest pain.   You develop shortness of breath.   You have an uncontrolled or severe headache.   You develop numbness or slurred speech.   You develop visual problems.   You develop pain, redness, and swelling in the legs.  Document Released: 03/22/2011 Document Revised: 12/03/2012 Document Reviewed: 09/21/2012 [facility] Patient Information 2014 [place], [place]. Start OCs today use condoms x 1 pack  Return  for pap

## 2013-11-12 ENCOUNTER — Other Ambulatory Visit: Payer: Self-pay | Admitting: Adult Health

## 2014-02-15 ENCOUNTER — Encounter: Payer: Self-pay | Admitting: Adult Health

## 2014-06-16 ENCOUNTER — Ambulatory Visit (INDEPENDENT_AMBULATORY_CARE_PROVIDER_SITE_OTHER): Payer: 59 | Admitting: Adult Health

## 2014-06-16 ENCOUNTER — Encounter: Payer: Self-pay | Admitting: Adult Health

## 2014-06-16 VITALS — BP 144/80 | HR 71 | Ht 63.0 in | Wt 171.5 lb

## 2014-06-16 DIAGNOSIS — N898 Other specified noninflammatory disorders of vagina: Secondary | ICD-10-CM | POA: Diagnosis not present

## 2014-06-16 DIAGNOSIS — O10919 Unspecified pre-existing hypertension complicating pregnancy, unspecified trimester: Secondary | ICD-10-CM | POA: Insufficient documentation

## 2014-06-16 DIAGNOSIS — B373 Candidiasis of vulva and vagina: Secondary | ICD-10-CM | POA: Diagnosis not present

## 2014-06-16 DIAGNOSIS — Z3041 Encounter for surveillance of contraceptive pills: Secondary | ICD-10-CM

## 2014-06-16 DIAGNOSIS — I10 Essential (primary) hypertension: Secondary | ICD-10-CM | POA: Diagnosis not present

## 2014-06-16 DIAGNOSIS — B3731 Acute candidiasis of vulva and vagina: Secondary | ICD-10-CM | POA: Insufficient documentation

## 2014-06-16 LAB — POCT WET PREP (WET MOUNT): WBC WET PREP: POSITIVE

## 2014-06-16 MED ORDER — LISINOPRIL-HYDROCHLOROTHIAZIDE 10-12.5 MG PO TABS: 1.0000 | ORAL_TABLET | Freq: Every day | ORAL | Status: DC

## 2014-06-16 MED ORDER — TERCONAZOLE 0.4 % VA CREA: 1.0000 | TOPICAL_CREAM | Freq: Every day | VAGINAL | Status: DC

## 2014-06-16 MED ORDER — NORGESTIM-ETH ESTRAD TRIPHASIC 0.18/0.215/0.25 MG-35 MCG PO TABS: 1.0000 | ORAL_TABLET | Freq: Every day | ORAL | Status: DC

## 2014-06-16 NOTE — Patient Instructions (Signed)
Hypertension Hypertension, commonly called high blood pressure, is when the force of blood pumping through your arteries is too strong. Your arteries are the blood vessels that carry blood from your heart throughout your body. A blood pressure reading consists of a higher number over a lower number, such as 110/72. The higher number (systolic) is the pressure inside your arteries when your heart pumps. The lower number (diastolic) is the pressure inside your arteries when your heart relaxes. Ideally you want your blood pressure below 120/80. Hypertension forces your heart to work harder to pump blood. Your arteries may become narrow or stiff. Having hypertension puts you at risk for heart disease, stroke, and other problems.  RISK FACTORS Some risk factors for high blood pressure are controllable. Others are not.  Risk factors you cannot control include:   Race. You may be at higher risk if you are African American.  Age. Risk increases with age.  Gender. Men are at higher risk than women before age 45 years. After age 65, women are at higher risk than men. Risk factors you can control include:  Not getting enough exercise or physical activity.  Being overweight.  Getting too much fat, sugar, calories, or salt in your diet.  Drinking too much alcohol. SIGNS AND SYMPTOMS Hypertension does not usually cause signs or symptoms. Extremely high blood pressure (hypertensive crisis) may cause headache, anxiety, shortness of breath, and nosebleed. DIAGNOSIS  To check if you have hypertension, your health care provider will measure your blood pressure while you are seated, with your arm held at the level of your heart. It should be measured at least twice using the same arm. Certain conditions can cause a difference in blood pressure between your right and left arms. A blood pressure reading that is higher than normal on one occasion does not mean that you need treatment. If one blood pressure reading  is high, ask your health care provider about having it checked again. TREATMENT  Treating high blood pressure includes making lifestyle changes and possibly taking medicine. Living a healthy lifestyle can help lower high blood pressure. You may need to change some of your habits. Lifestyle changes may include:  Following the DASH diet. This diet is high in fruits, vegetables, and whole grains. It is low in salt, red meat, and added sugars.  Getting at least 2 hours of brisk physical activity every week.  Losing weight if necessary.  Not smoking.  Limiting alcoholic beverages.  Learning ways to reduce stress. If lifestyle changes are not enough to get your blood pressure under control, your health care provider may prescribe medicine. You may need to take more than one. Work closely with your health care provider to understand the risks and benefits. HOME CARE INSTRUCTIONS  Have your blood pressure rechecked as directed by your health care provider.   Take medicines only as directed by your health care provider. Follow the directions carefully. Blood pressure medicines must be taken as prescribed. The medicine does not work as well when you skip doses. Skipping doses also puts you at risk for problems.   Do not smoke.   Monitor your blood pressure at home as directed by your health care provider. SEEK MEDICAL CARE IF:   You think you are having a reaction to medicines taken.  You have recurrent headaches or feel dizzy.  You have swelling in your ankles.  You have trouble with your vision. SEEK IMMEDIATE MEDICAL CARE IF:  You develop a severe headache or confusion.    You have unusual weakness, numbness, or feel faint.  You have severe chest or abdominal pain.  You vomit repeatedly.  You have trouble breathing. MAKE SURE YOU:   Understand these instructions.  Will watch your condition.  Will get help right away if you are not doing well or get worse. Document  Released: 04/02/2005 Document Revised: 08/17/2013 Document Reviewed: 01/23/2013 [facility] Patient Information 2015 [place], [place]. This information is not intended to replace advice given to you by your health care provider. Make sure you discuss any questions you have with your health care provider. Monilial Vaginitis Vaginitis in a soreness, swelling and redness (inflammation) of the vagina and vulva. Monilial vaginitis is not a sexually transmitted infection. CAUSES  Yeast vaginitis is caused by yeast (candida) that is normally found in your vagina. With a yeast infection, the candida has overgrown in number to a point that upsets the chemical balance. SYMPTOMS   White, thick vaginal discharge.  Swelling, itching, redness and irritation of the vagina and possibly the lips of the vagina (vulva).  Burning or painful urination.  Painful intercourse. DIAGNOSIS  Things that may contribute to monilial vaginitis are:  Postmenopausal and virginal states.  Pregnancy.  Infections.  Being tired, sick or stressed, especially if you had monilial vaginitis in the past.  Diabetes. Good control will help lower the chance.  Birth control pills.  Tight fitting garments.  Using bubble bath, feminine sprays, douches or deodorant tampons.  Taking certain medications that kill germs (antibiotics).  Sporadic recurrence can occur if you become ill. TREATMENT  Your caregiver will give you medication.  There are several kinds of anti monilial vaginal creams and suppositories specific for monilial vaginitis. For recurrent yeast infections, use a suppository or cream in the vagina 2 times a week, or as directed.  Anti-monilial or steroid cream for the itching or irritation of the vulva may also be used. Get your caregiver's permission.  Painting the vagina with methylene blue solution may help if the monilial cream does not work.  Eating yogurt may help prevent monilial vaginitis. HOME CARE  INSTRUCTIONS   Finish all medication as prescribed.  Do not have sex until treatment is completed or after your caregiver tells you it is okay.  Take warm sitz baths.  Do not douche.  Do not use tampons, especially scented ones.  Wear cotton underwear.  Avoid tight pants and panty hose.  Tell your sexual partner that you have a yeast infection. They should go to their caregiver if they have symptoms such as mild rash or itching.  Your sexual partner should be treated as well if your infection is difficult to eliminate.  Practice safer sex. Use condoms.  Some vaginal medications cause latex condoms to fail. Vaginal medications that harm condoms are:  Cleocin cream.  Butoconazole (Femstat).  Terconazole (Terazol) vaginal suppository.  Miconazole (Monistat) (may be purchased over the counter). SEEK MEDICAL CARE IF:   You have a temperature by mouth above 102 F (38.9 C).  The infection is getting worse after 2 days of treatment.  The infection is not getting better after 3 days of treatment.  You develop blisters in or around your vagina.  You develop vaginal bleeding, and it is not your menstrual period.  You have pain when you urinate.  You develop intestinal problems.  You have pain with sexual intercourse. Document Released: 01/10/2005 Document Revised: 06/25/2011 Document Reviewed: 09/24/2008 [facility] Patient Information 2015 [place], [place]. This information is not intended to replace advice given to  you by your health care provider. Make sure you discuss any questions you have with your health care provider. Decrease carbs and salts Return in 3 months for physical Get PCP about migraines and BP

## 2014-06-16 NOTE — Progress Notes (Signed)
Subjective:     Patient ID: Kristina Strickland, female   DOB: March 28, 1986, 29 y.o.   MRN: [id number]  HPI Zeyla is a 29 year old white female in complains of vaginal discharge with itch and needs refill on OCs.She is out of her BP meds and has taken grandparent.She complains with her weight and headaches more frequent ? Migraine no aura,but vision blurs at times.Last pap 2015.  Review of Systems See HPI for positives, all other systems negative Reviewed past medical,surgical, social and family history. Reviewed medications and allergies.     Objective:   Physical Exam BP 144/80 mmHg  Pulse 71  Ht 5\' 3"  (1.6 m)  Wt 171 lb 8 oz (77.792 kg)  BMI 30.39 kg/m2  LMP 05/18/2014  Skin warm and dry.Pelvic: external genitalia is normal in appearance no lesions, vagina: white discharge without odor,urethra has no lesions or masses noted, cervix:smooth and bulbous, uterus: normal size, shape and contour, non tender, no masses felt, adnexa: no masses or tenderness noted. Bladder is non tender and no masses felt. Wet prep: + for yeat and +WBCs.     Assessment:     Vaginal discharge Yeast Contraceptive management Hypertension     Plan:     Refilled tri sprintec take 1 daily with 11 refills Refilled lisinopril-hydrochlorothiazide 10/12.5 mg #30 1 daily with 3 refills Rx terazol 7 cream with 1 refill, use 1 applicator at HS x 7 nights Return in 3 months for physical, will get labs if no PCP Get PCP for BP and migraines Review handout on hypertension and yeast Decrease carbs and salt and try weight watchers

## 2014-06-21 ENCOUNTER — Ambulatory Visit: Payer: Self-pay | Admitting: Adult Health

## 2014-06-30 IMAGING — CT CT ABD-PELV W/ CM
2 of 3 series · 16 of 46 positions shown, 18 images · IV contrast (omnipaque)
Comparison: None.

CLINICAL DATA: Abdominal pain with bloating and nausea.

EXAM:
CT ABDOMEN AND PELVIS WITH CONTRAST
TECHNIQUE: Multidetector CT imaging of the abdomen and pelvis was performed
using the standard protocol following bolus administration of
intravenous contrast.
CONTRAST:  50mL OMNIPAQUE IOHEXOL 300 MG/ML SOLN, 100mL OMNIPAQUE
IOHEXOL 300 MG/ML SOLN

[Series 2: abd_pel_with 5.0 b40f · axial · 0.69mm/px · z∈[+306,+710]mm · 13 of 93 slices shown, 15 images]
[im 6/93  soft-tissue]
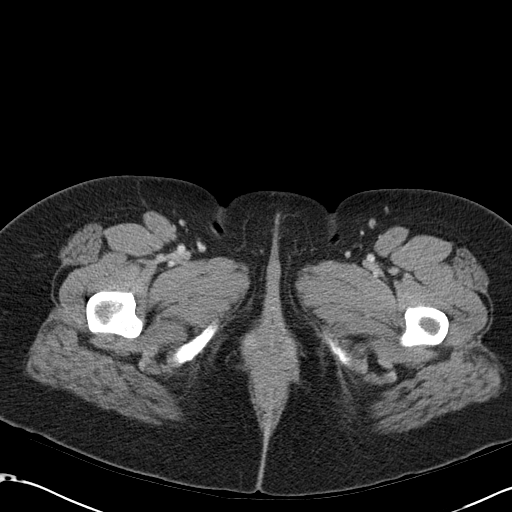
[im 6/93  bone]
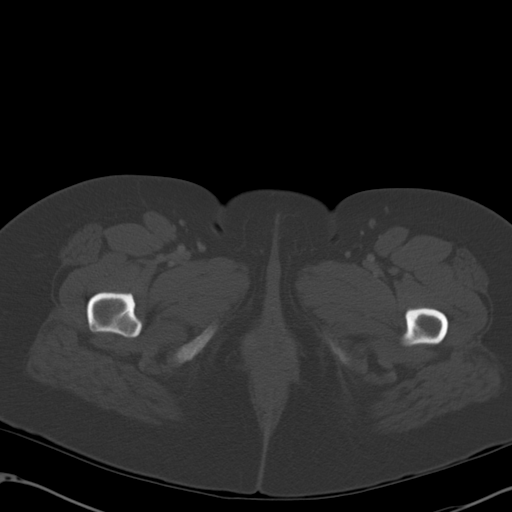
[im 12/93  soft-tissue]
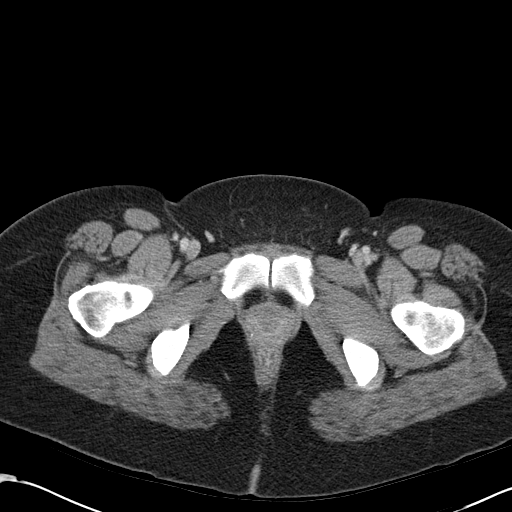
[im 18/93  soft-tissue]
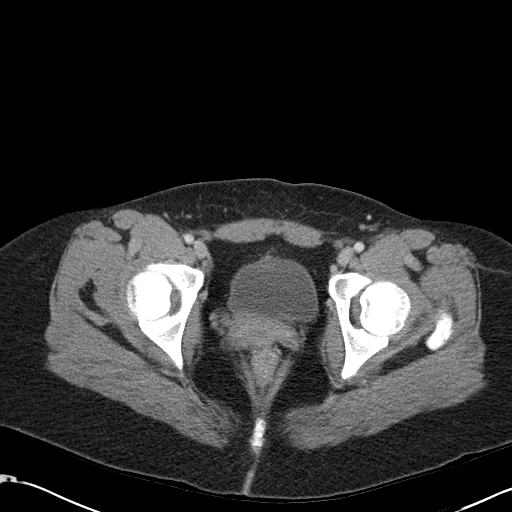
[im 27/93  soft-tissue]
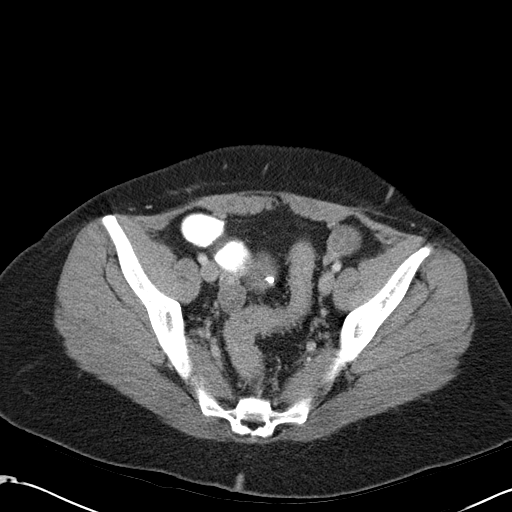
[im 33/93  soft-tissue]
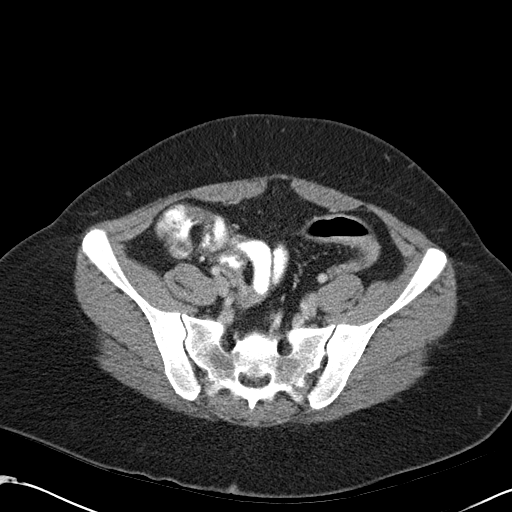
[im 39/93  soft-tissue]
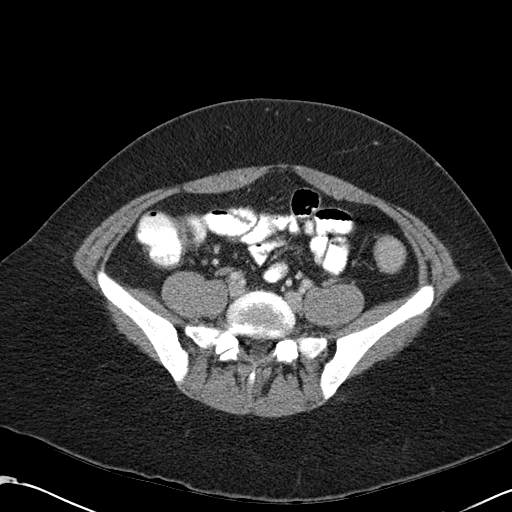
[im 48/93  soft-tissue]
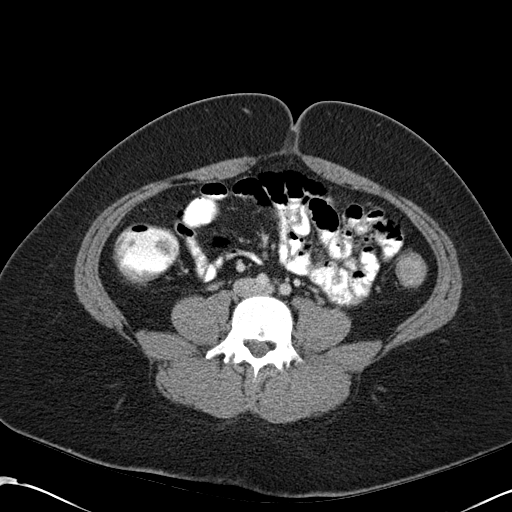
[im 54/93  soft-tissue]
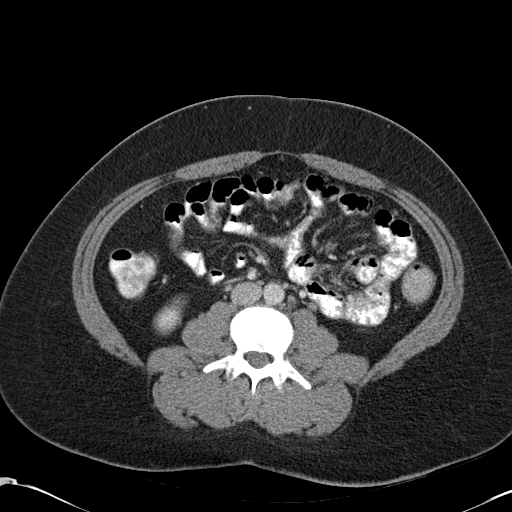
[im 60/93  soft-tissue]
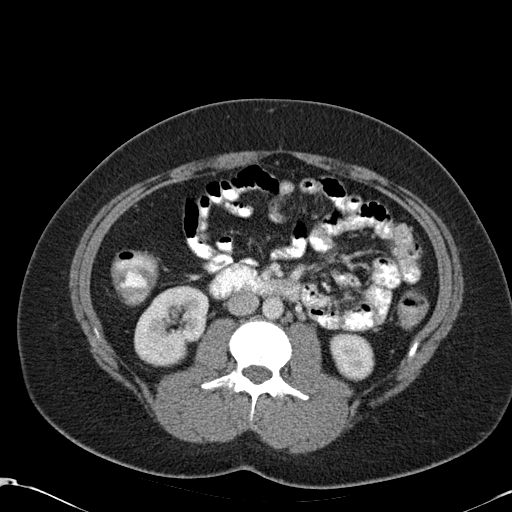
[im 60/93  bone]
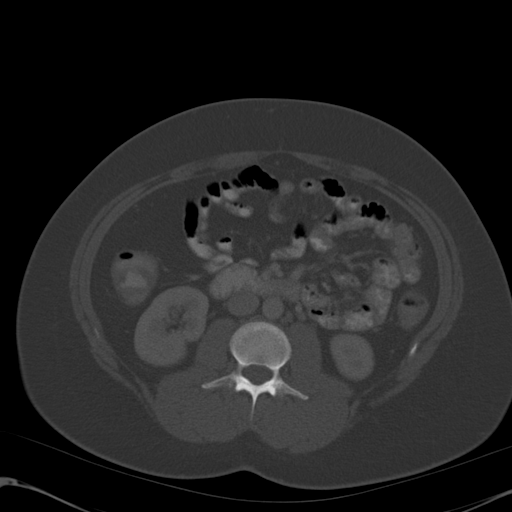
[im 66/93  soft-tissue]
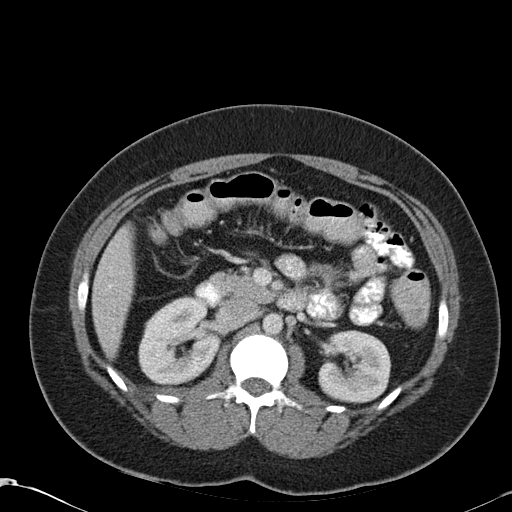
[im 75/93  soft-tissue]
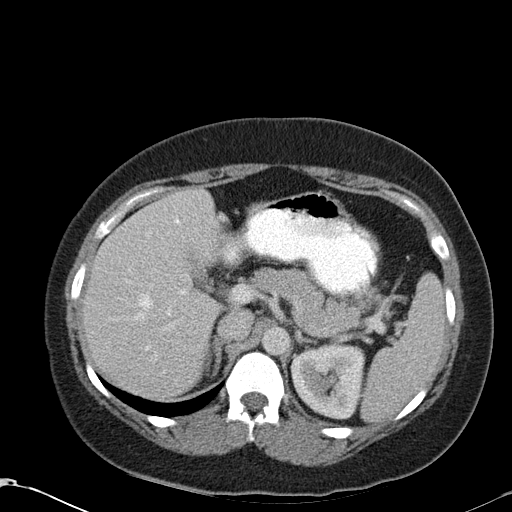
[im 81/93  soft-tissue]
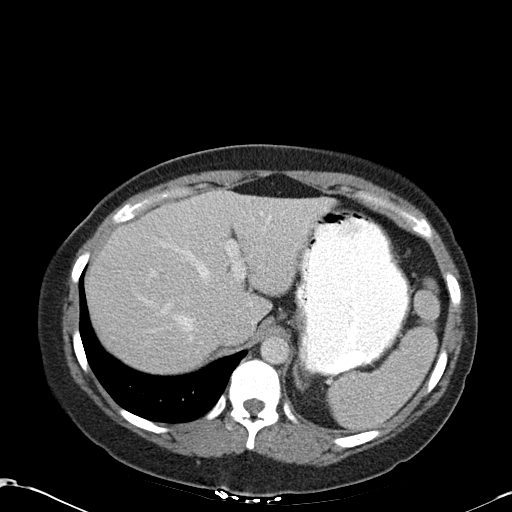
[im 87/93  soft-tissue]
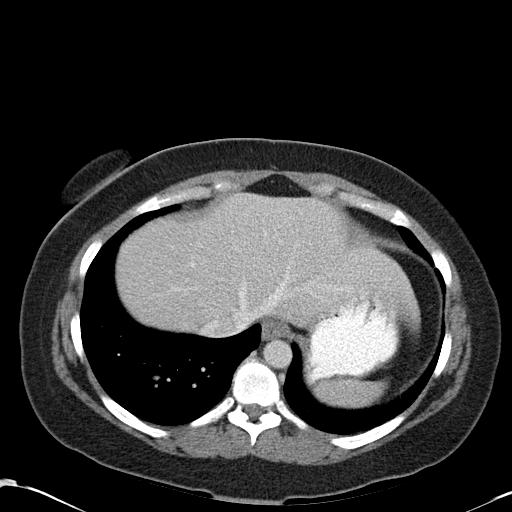

[Series 4: abd_pel_with 3.0 spo · coronal · 0.64mm/px · 3 of 83 slices shown]
[im 28/83  soft-tissue]
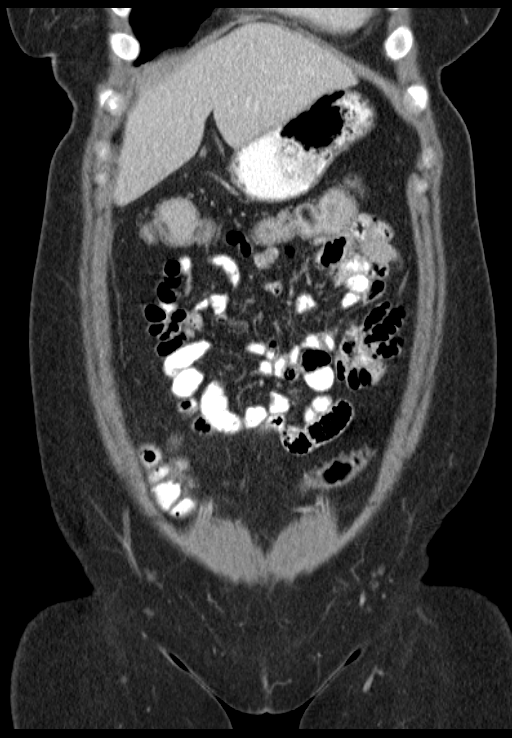
[im 37/83  soft-tissue]
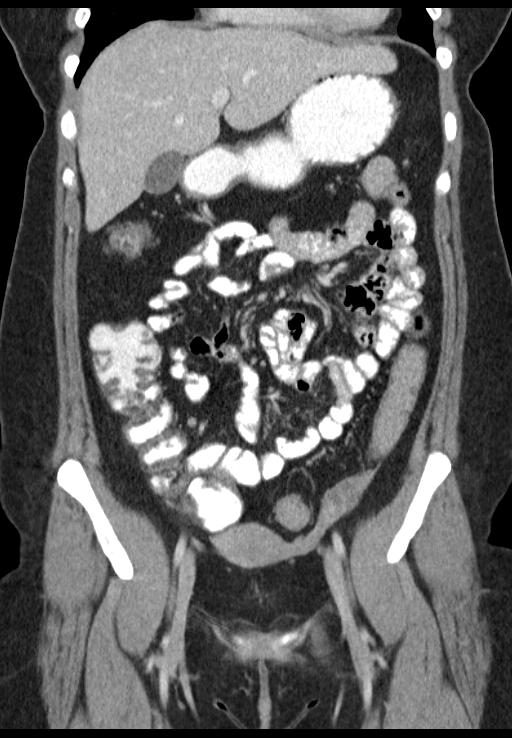
[im 46/83  soft-tissue]
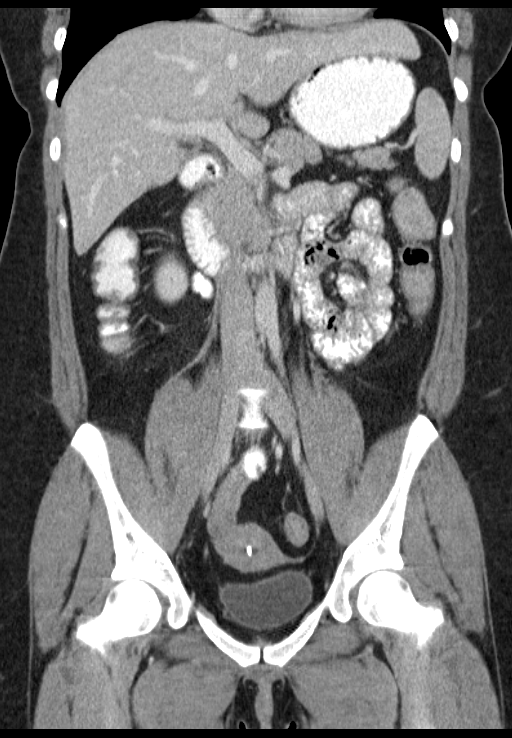

[16 of 46 positions shown; findings below may reference images not displayed]

FINDINGS: The lung bases are clear.

The liver, spleen, gallbladder, pancreas, adrenal glands, kidneys,
abdominal aorta, inferior vena cava, and retroperitoneal lymph nodes
are unremarkable. The stomach, small bowel, and colon are not
abnormally distended and no specific wall thickening is appreciated.
No free air or free fluid in the abdomen.

Pelvis: The rectosigmoid colon is decompressed without evidence of
inflammatory change. The bladder wall is not thickened. The uterus
is anteverted. Intrauterine device is present. Ovaries are not
enlarged. Appendix is surgically absent. No free or loculated pelvic
fluid collections. No significant pelvic lymphadenopathy. Normal
alignment of the lumbar spine. No destructive bone lesions
appreciated.
IMPRESSION: No acute process demonstrated in the abdomen or pelvis.

## 2014-08-17 ENCOUNTER — Telehealth: Payer: Self-pay | Admitting: Family Medicine

## 2014-08-17 NOTE — Telephone Encounter (Signed)
Appointment given for May 27 with Tiffany.

## 2014-09-10 ENCOUNTER — Ambulatory Visit (INDEPENDENT_AMBULATORY_CARE_PROVIDER_SITE_OTHER): Payer: 59 | Admitting: Physician Assistant

## 2014-09-10 ENCOUNTER — Encounter: Payer: Self-pay | Admitting: Physician Assistant

## 2014-09-10 VITALS — BP 121/85 | HR 84 | Temp 97.1°F | Ht 63.0 in | Wt 163.0 lb

## 2014-09-10 DIAGNOSIS — E669 Obesity, unspecified: Secondary | ICD-10-CM | POA: Diagnosis not present

## 2014-09-10 DIAGNOSIS — I10 Essential (primary) hypertension: Secondary | ICD-10-CM | POA: Diagnosis not present

## 2014-09-10 DIAGNOSIS — B373 Candidiasis of vulva and vagina: Secondary | ICD-10-CM | POA: Diagnosis not present

## 2014-09-10 DIAGNOSIS — B3731 Acute candidiasis of vulva and vagina: Secondary | ICD-10-CM

## 2014-09-10 DIAGNOSIS — G43019 Migraine without aura, intractable, without status migrainosus: Secondary | ICD-10-CM | POA: Diagnosis not present

## 2014-09-10 DIAGNOSIS — Z0000 Encounter for general adult medical examination without abnormal findings: Secondary | ICD-10-CM

## 2014-09-10 LAB — POCT CBC
GRANULOCYTE PERCENT: 66.4 % (ref 37–80)
HCT, POC: 42.4 % (ref 37.7–47.9)
Hemoglobin: 13.2 g/dL (ref 12.2–16.2)
Lymph, poc: 1.2 (ref 0.6–3.4)
MCH: 27.7 pg (ref 27–31.2)
MCHC: 31.1 g/dL — AB (ref 31.8–35.4)
MCV: 89 fL (ref 80–97)
MPV: 6.6 fL (ref 0–99.8)
PLATELET COUNT, POC: 262 10*3/uL (ref 142–424)
POC Granulocyte: 3.3 (ref 2–6.9)
POC LYMPH PERCENT: 24.5 %L (ref 10–50)
RBC: 4.76 M/uL (ref 4.04–5.48)
RDW, POC: 13.1 %
WBC: 4.9 10*3/uL (ref 4.6–10.2)

## 2014-09-10 MED ORDER — OLMESARTAN MEDOXOMIL 20 MG PO TABS: 20.0000 mg | ORAL_TABLET | Freq: Every day | ORAL | Status: DC

## 2014-09-10 MED ORDER — BUTALBITAL-APAP-CAFFEINE 50-325-40 MG PO TABS: ORAL_TABLET | ORAL | Status: DC

## 2014-09-10 MED ORDER — FLUCONAZOLE 150 MG PO TABS: ORAL_TABLET | ORAL | Status: DC

## 2014-09-10 NOTE — Patient Instructions (Signed)
Exercise to Lose Weight Exercise and a healthy diet may help you lose weight. Your doctor may suggest specific exercises. EXERCISE IDEAS AND TIPS  Choose low-cost things you enjoy doing, such as walking, bicycling, or exercising to workout videos.  Take stairs instead of the elevator.  Walk during your lunch break.  Park your car further away from work or school.  Go to a gym or an exercise class.  Start with 5 to 10 minutes of exercise each day. Build up to 30 minutes of exercise 4 to 6 days a week.  Wear shoes with good support and comfortable clothes.  Stretch before and after working out.  Work out until you breathe harder and your heart beats faster.  Drink extra water when you exercise.  Do not do so much that you hurt yourself, feel dizzy, or get very short of breath. Exercises that burn about 150 calories:  Running 1  miles in 15 minutes.  Playing volleyball for 45 to 60 minutes.  Washing and waxing a car for 45 to 60 minutes.  Playing touch football for 45 minutes.  Walking 1  miles in 35 minutes.  Pushing a stroller 1  miles in 30 minutes.  Playing basketball for 30 minutes.  Raking leaves for 30 minutes.  Bicycling 5 miles in 30 minutes.  Walking 2 miles in 30 minutes.  Dancing for 30 minutes.  Shoveling snow for 15 minutes.  Swimming laps for 20 minutes.  Walking up stairs for 15 minutes.  Bicycling 4 miles in 15 minutes.  Gardening for 30 to 45 minutes.  Jumping rope for 15 minutes.  Washing windows or floors for 45 to 60 minutes. Document Released: 05/05/2010 Document Revised: 06/25/2011 Document Reviewed: 05/05/2010 [employer] Patient Information 2015 [employer]. This information is not intended to replace advice given to you by your health care provider. Make sure you discuss any questions you have with your health care provider. Calorie Counting for Weight Loss Calories are energy you get from the things you eat and drink. Your  body uses this energy to keep you going throughout the day. The number of calories you eat affects your weight. When you eat more calories than your body needs, your body stores the extra calories as fat. When you eat fewer calories than your body needs, your body burns fat to get the energy it needs. Calorie counting means keeping track of how many calories you eat and drink each day. If you make sure to eat fewer calories than your body needs, you should lose weight. In order for calorie counting to work, you will need to eat the number of calories that are right for you in a day to lose a healthy amount of weight per week. A healthy amount of weight to lose per week is usually 1-2 lb (0.5-0.9 kg). A dietitian can determine how many calories you need in a day and give you suggestions on how to reach your calorie goal.  WHAT IS MY MY PLAN? My goal is to have __________ calories per day.  If I have this many calories per day, I should lose around __________ pounds per week. WHAT DO I NEED TO KNOW ABOUT CALORIE COUNTING? In order to meet your daily calorie goal, you will need to:  Find out how many calories are in each food you would like to eat. Try to do this before you eat.  Decide how much of the food you can eat.  Write down what you ate and   how many calories it had. Doing this is called keeping a food log. WHERE DO I FIND CALORIE INFORMATION? The number of calories in a food can be found on a Nutrition Facts label. Note that all the information on a label is based on a specific serving of the food. If a food does not have a Nutrition Facts label, try to look up the calories online or ask your dietitian for help. HOW DO I DECIDE HOW MUCH TO EAT? To decide how much of the food you can eat, you will need to consider both the number of calories in one serving and the size of one serving. This information can be found on the Nutrition Facts label. If a food does not have a Nutrition Facts label, look  up the information online or ask your dietitian for help. Remember that calories are listed per serving. If you choose to have more than one serving of a food, you will have to multiply the calories per serving by the amount of servings you plan to eat. For example, the label on a package of bread might say that a serving size is 1 slice and that there are 90 calories in a serving. If you eat 1 slice, you will have eaten 90 calories. If you eat 2 slices, you will have eaten 180 calories. HOW DO I KEEP A FOOD LOG? After each meal, record the following information in your food log:  What you ate.  How much of it you ate.  How many calories it had.  Then, add up your calories. Keep your food log near you, such as in a small notebook in your pocket. Another option is to use a mobile app or website. Some programs will calculate calories for you and show you how many calories you have left each time you add an item to the log. WHAT ARE SOME CALORIE COUNTING TIPS?  Use your calories on foods and drinks that will fill you up and not leave you hungry. Some examples of this include foods like nuts and nut butters, vegetables, lean proteins, and high-fiber foods (more than 5 g fiber per serving).  Eat nutritious foods and avoid empty calories. Empty calories are calories you get from foods or beverages that do not have many nutrients, such as candy and soda. It is better to have a nutritious high-calorie food (such as an avocado) than a food with few nutrients (such as a bag of chips).  Know how many calories are in the foods you eat most often. This way, you do not have to look up how many calories they have each time you eat them.  Look out for foods that may seem like low-calorie foods but are really high-calorie foods, such as baked goods, soda, and fat-free candy.  Pay attention to calories in drinks. Drinks such as sodas, specialty coffee drinks, alcohol, and juices have a lot of calories yet do  not fill you up. Choose low-calorie drinks like water and diet drinks.  Focus your calorie counting efforts on higher calorie items. Logging the calories in a garden salad that contains only vegetables is less important than calculating the calories in a milk shake.  Find a way of tracking calories that works for you. Get creative. Most people who are successful find ways to keep track of how much they eat in a day, even if they do not count every calorie. WHAT ARE SOME PORTION CONTROL TIPS?  Know how many calories are in a   serving. This will help you know how many servings of a certain food you can have.  Use a measuring cup to measure serving sizes. This is helpful when you start out. With time, you will be able to estimate serving sizes for some foods.  Take some time to put servings of different foods on your favorite plates, bowls, and cups so you know what a serving looks like.  Try not to eat straight from a bag or box. Doing this can lead to overeating. Put the amount you would like to eat in a cup or on a plate to make sure you are eating the right portion.  Use smaller plates, glasses, and bowls to prevent overeating. This is a quick and easy way to practice portion control. If your plate is smaller, less food can fit on it.  Try not to multitask while eating, such as watching TV or using your computer. If it is time to eat, sit down at a table and enjoy your food. Doing this will help you to start recognizing when you are full. It will also make you more aware of what and how much you are eating. HOW CAN I CALORIE COUNT WHEN EATING OUT?  Ask for smaller portion sizes or child-sized portions.  Consider sharing an entree and sides instead of getting your own entree.  If you get your own entree, eat only half. Ask for a box at the beginning of your meal and put the rest of your entree in it so you are not tempted to eat it.  Look for the calories on the menu. If calories are listed,  choose the lower calorie options.  Choose dishes that include vegetables, fruits, whole grains, low-fat dairy products, and lean protein. Focusing on smart food choices from each of the 5 food groups can help you stay on track at restaurants.  Choose items that are boiled, broiled, grilled, or steamed.  Choose water, milk, unsweetened iced tea, or other drinks without added sugars. If you want an alcoholic beverage, choose a lower calorie option. For example, a regular margarita can have up to 700 calories and a glass of wine has around 150.  Stay away from items that are buttered, battered, fried, or served with cream sauce. Items labeled "crispy" are usually fried, unless stated otherwise.  Ask for dressings, sauces, and syrups on the side. These are usually very high in calories, so do not eat much of them.  Watch out for salads. Many people think salads are a healthy option, but this is often not the case. Many salads come with bacon, fried chicken, lots of cheese, fried chips, and dressing. All of these items have a lot of calories. If you want a salad, choose a garden salad and ask for grilled meats or steak. Ask for the dressing on the side, or ask for olive oil and vinegar or lemon to use as dressing.  Estimate how many servings of a food you are given. For example, a serving of cooked rice is  cup or about the size of half a tennis ball or one cupcake wrapper. Knowing serving sizes will help you be aware of how much food you are eating at restaurants. The list below tells you how big or small some common portion sizes are based on everyday objects.  1 oz--4 stacked dice.  3 oz--1 deck of cards.  1 tsp--1 dice.  1 Tbsp-- a Ping-Pong ball.  2 Tbsp--1 Ping-Pong ball.   cup--1 tennis ball   or 1 cupcake wrapper.  1 cup--1 baseball. Document Released: 04/02/2005 Document Revised: 08/17/2013 Document Reviewed: 02/05/2013 [facility] Patient Information 2015 [place], [place]. This  information is not intended to replace advice given to you by your health care provider. Make sure you discuss any questions you have with your health care provider. Obesity Obesity is having too much body fat and a body mass index (BMI) of 30 or more. BMI is a number based on your height and weight. The number is an estimate of how much body fat you have. Obesity can happen if you eat more calories than you can burn by exercising or other activity. It can cause major health problems or emergencies.  HOME CARE  Exercise and be active as told by your doctor. Try:  Using stairs when you can.  Parking farther away from store doors.  Gardening, biking, or walking.  Eat healthy foods and drinks that are low in calories. Eat more fruits and vegetables.  Limit fast food, sweets, and snack foods that are made with ingredients that are not natural (processed food).  Eat smaller amounts of food.  Keep a journal and write down what you eat every day. Websites can help with this.  Avoid drinking alcohol. Drink more water and drinks without calories.   Take vitamins and dietary pills (supplements) only as told by your doctor.  Try going to weight-loss support groups or classes to help lessen stress. Dietitians and counselors may also help. GET HELP RIGHT AWAY IF:  You have chest pain or tightness.  You have trouble breathing or feel short of breath.  You feel weak or have loss of feeling (numbness) in your legs.  You feel confused or have trouble talking.  You have sudden changes in your vision. MAKE SURE YOU:  Understand these instructions.  Will watch your condition.  Will get help right away if you are not doing well or get worse. Document Released: 06/25/2011 Document Revised: 08/17/2013 Document Reviewed: 06/25/2011 [facility] Patient Information 2015 [place], [place]. This information is not intended to replace advice given to you by your health care provider. Make sure you discuss  any questions you have with your health care provider.

## 2014-09-10 NOTE — Progress Notes (Signed)
   Subjective:    Patient ID: Kristina Strickland, female    DOB: [birth year], 29 y.o.   MRN: [id number]  HPI 29 y/o female presents for establishment of care. She has been taking Benicar 37m for htn with success in treatment. She also has migraines twice monthly. She has tried Naprosyn in the past with no relief.   She states that she is also concerned about her weight. She has gained 20 pounds. She exercises daily x 33m ( squats, jumping jacks, combo of cardio and resistance training) She states that she tries to monitor her diet but has had no success. She feels that she gained weight after getting her IUD.    Review of Systems  Neurological: Positive for headaches (migranous, 2-3 times monthly ).       Objective:   Physical Exam  Constitutional: She is oriented to person, place, and time. She appears well-developed and well-nourished. No distress.  Cardiovascular: Normal rate, regular rhythm, normal heart sounds and intact distal pulses.  Exam reveals no gallop and no friction rub.   No murmur heard. Pulmonary/Chest: Effort normal and breath sounds normal. No respiratory distress. She has no wheezes. She has no rales. She exhibits no tenderness.  Neurological: She is alert and oriented to person, place, and time.  Skin: She is not diaphoretic.  Psychiatric: She has a normal mood and affect. Her behavior is normal. Judgment and thought content normal.  Nursing note and vitals reviewed.         Assessment & Plan:  1. Essential hypertension  - olmesartan (BENICAR) 20 MG tablet; Take 1 tablet (20 mg total) by mouth daily.  Dispense: 30 tablet; Refill: 5 Advised patient to monitor BP at home and f/u if >140/90 for increase in dosage - POCT CBC; Future - CMP14+EGFR; Future  2. Intractable migraine without aura and without status migrainosus  - Fioricet as directed    3. Obesity - Encouraged continuing exercise, healthy eating.  - If labs WNL, will prescribe Phentermine 37.57m20m 1  month. Advised patient that this will be short term only  - Thyroid Panel With TSH; Future - Lipid panel; Future -  4. Preventative health care  - POCT CBC; Future - CMP14+EGFR; Future - Thyroid Panel With TSH; Future - Lipid panel; Future  5. Vulvovaginal candidiasis  fluconazole (DIFLUCAN) 150 MG tablet; Take 1 tablet. Repeat in 3 days  Dispense: 3 tablet; Refill: 0  Continue all meds Labs pending Health Maintenance reviewed Diet and exercise encouraged RTO 6 months   Tiffany A. GanBenjamin Stain-C

## 2014-09-11 LAB — THYROID PANEL WITH TSH
Free Thyroxine Index: 2.1 (ref 1.2–4.9)
T3 Uptake Ratio: 20 % — ABNORMAL LOW (ref 24–39)
T4 TOTAL: 10.6 ug/dL (ref 4.5–12.0)
TSH: 2.49 u[IU]/mL (ref 0.450–4.500)

## 2014-09-11 LAB — LIPID PANEL
CHOL/HDL RATIO: 2.3 ratio (ref 0.0–4.4)
CHOLESTEROL TOTAL: 170 mg/dL (ref 100–199)
HDL: 73 mg/dL (ref >39)
LDL Calculated: 81 mg/dL (ref 0–99)
Triglycerides: 81 mg/dL (ref 0–149)
VLDL CHOLESTEROL CAL: 16 mg/dL (ref 5–40)

## 2014-09-11 LAB — CMP14+EGFR
ALK PHOS: 46 IU/L (ref 39–117)
ALT: 23 IU/L (ref 0–32)
AST: 22 IU/L (ref 0–40)
Albumin/Globulin Ratio: 1.8 (ref 1.1–2.5)
Albumin: 4.4 g/dL (ref 3.5–5.5)
BILIRUBIN TOTAL: 0.5 mg/dL (ref 0.0–1.2)
BUN/Creatinine Ratio: 22 — ABNORMAL HIGH (ref 8–20)
BUN: 16 mg/dL (ref 6–20)
CALCIUM: 9.4 mg/dL (ref 8.7–10.2)
CO2: 24 mmol/L (ref 18–29)
CREATININE: 0.72 mg/dL (ref 0.57–1.00)
Chloride: 97 mmol/L (ref 97–108)
GFR calc Af Amer: 131 mL/min/{1.73_m2} (ref >59)
GFR calc non Af Amer: 114 mL/min/{1.73_m2} (ref >59)
Globulin, Total: 2.5 g/dL (ref 1.5–4.5)
Glucose: 87 mg/dL (ref 65–99)
POTASSIUM: 4 mmol/L (ref 3.5–5.2)
SODIUM: 138 mmol/L (ref 134–144)
Total Protein: 6.9 g/dL (ref 6.0–8.5)

## 2014-09-14 ENCOUNTER — Other Ambulatory Visit: Payer: Self-pay | Admitting: Physician Assistant

## 2014-09-14 DIAGNOSIS — E669 Obesity, unspecified: Secondary | ICD-10-CM

## 2014-09-14 MED ORDER — PHENTERMINE HCL 37.5 MG PO TABS: 37.5000 mg | ORAL_TABLET | Freq: Every day | ORAL | Status: DC

## 2014-09-20 ENCOUNTER — Other Ambulatory Visit: Payer: 59 | Admitting: Adult Health

## 2014-09-22 ENCOUNTER — Other Ambulatory Visit: Payer: 59 | Admitting: Adult Health

## 2014-10-20 ENCOUNTER — Ambulatory Visit (INDEPENDENT_AMBULATORY_CARE_PROVIDER_SITE_OTHER): Payer: 59 | Admitting: Physician Assistant

## 2014-10-20 ENCOUNTER — Encounter: Payer: Self-pay | Admitting: Physician Assistant

## 2014-10-20 VITALS — BP 124/85 | HR 87 | Temp 98.2°F | Ht 63.0 in | Wt 154.0 lb

## 2014-10-20 DIAGNOSIS — E669 Obesity, unspecified: Secondary | ICD-10-CM | POA: Diagnosis not present

## 2014-10-20 DIAGNOSIS — E663 Overweight: Secondary | ICD-10-CM | POA: Diagnosis not present

## 2014-10-20 MED ORDER — PHENTERMINE HCL 37.5 MG PO TABS: 37.5000 mg | ORAL_TABLET | Freq: Every day | ORAL | Status: DC

## 2014-10-20 NOTE — Progress Notes (Signed)
   Subjective:    Patient ID: Kristina Strickland, female    DOB: May 12, 1985, 29 y.o.   MRN: [id number]  HPI 29 y/o female presents for follow up of weight loss. She was given a prescription for Phentermine 37.5mg  at her last visit and I advised her that this will not be a medication that I will prescribe long term. She has also made changes in her diet and is exercising 4-5 days/week. She has lost 9 pounds with these changes.     Review of Systems  Constitutional: Positive for activity change (increased physical activity ) and appetite change (decreased appetitie). Negative for fatigue.  HENT: Negative.   Eyes: Negative.   Respiratory: Negative for chest tightness and shortness of breath.   Cardiovascular: Negative for chest pain and palpitations.  Gastrointestinal: Negative.   Endocrine: Negative.   Neurological: Negative for dizziness, syncope, weakness and headaches.  Hematological: Negative.   Psychiatric/Behavioral: Negative.        Objective:   Physical Exam  Constitutional: She is oriented to person, place, and time. She appears well-developed and well-nourished.  Pulmonary/Chest: Effort normal and breath sounds normal. No respiratory distress. She has no wheezes. She has no rales. She exhibits no tenderness.  Musculoskeletal: Normal range of motion. She exhibits no edema or tenderness.  Neurological: She is alert and oriented to person, place, and time.  Psychiatric: She has a normal mood and affect. Her behavior is normal. Judgment and thought content normal.  Nursing note and vitals reviewed.         Assessment & Plan:  1. Obesity   2. Overweight (BMI 25.0-29.9)  - phentermine (ADIPEX-P) 37.5 MG tablet; Take 1 tablet (37.5 mg total) by mouth daily before breakfast. 1/2 tablet PO in the afternoon  Dispense: 45 tablet; Refill: 1   RTO prn   Aaronmichael Brumbaugh A. Chauncey Reading PA-C

## 2014-10-25 IMAGING — US US TRANSVAGINAL NON-OB
1 series · 14 of 25 positions shown · non-contrast
Comparison: None

CLINICAL DATA: Evaluate IUD position



[Series 1: us transvaginal non-ob · 0.18mm/px · 14 of 82 slices shown]
[im 1/82]
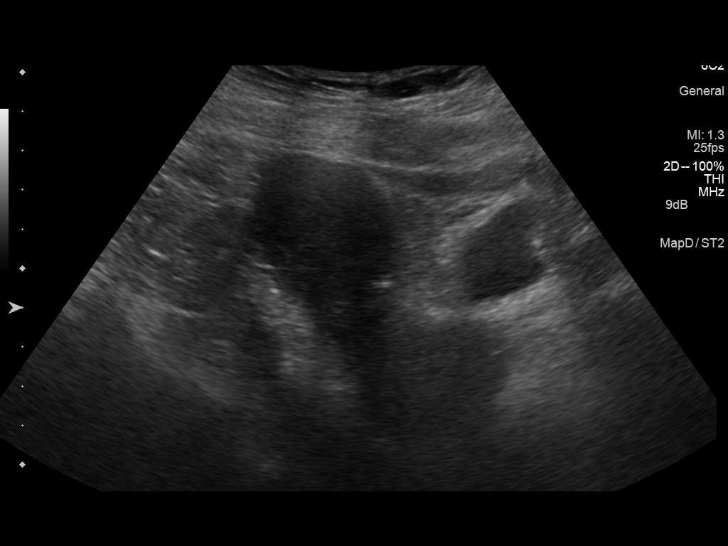
[im 7/82]
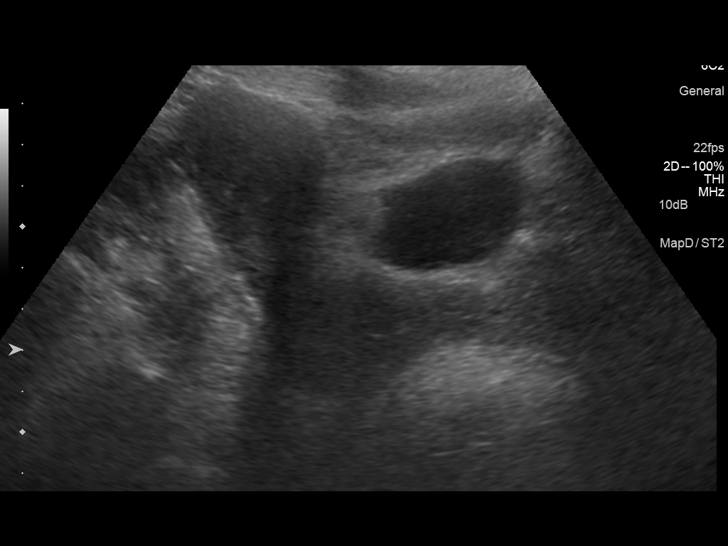
[im 14/82]
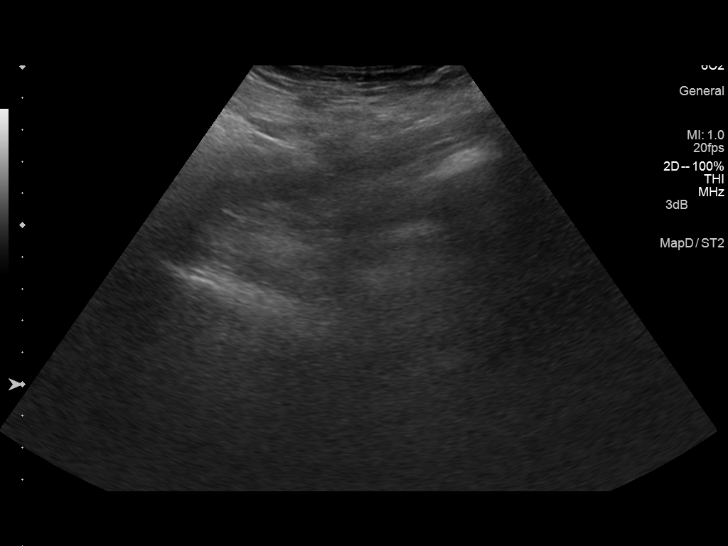
[im 21/82]
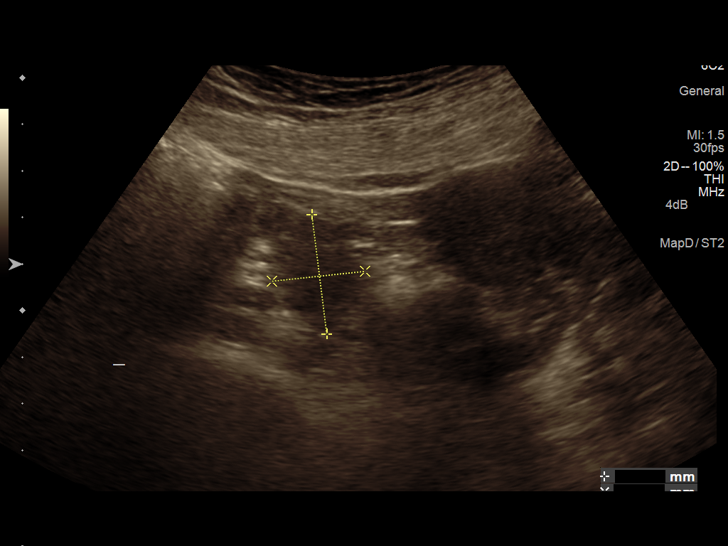
[im 28/82]
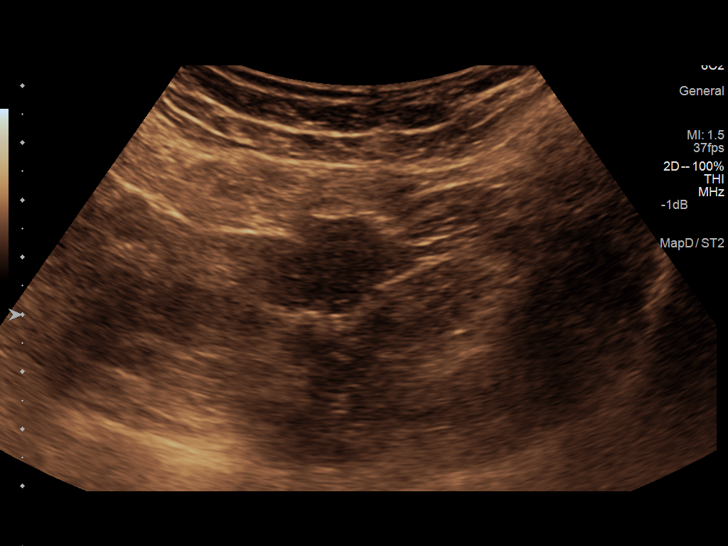
[im 31/82]
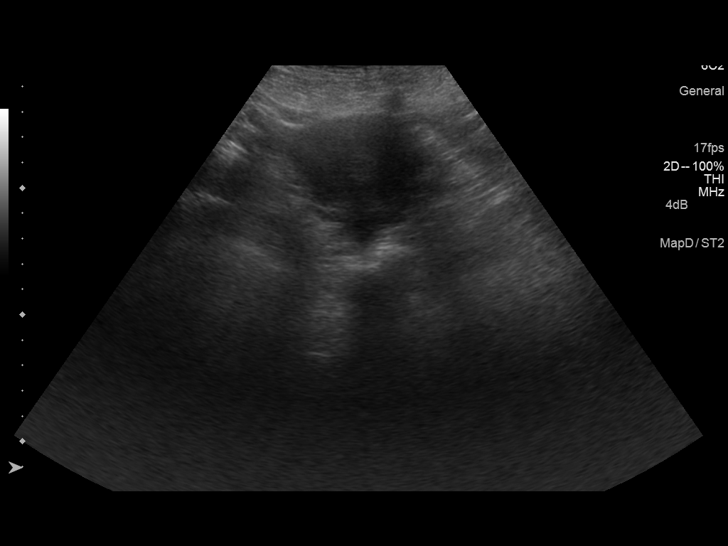
[im 38/82]
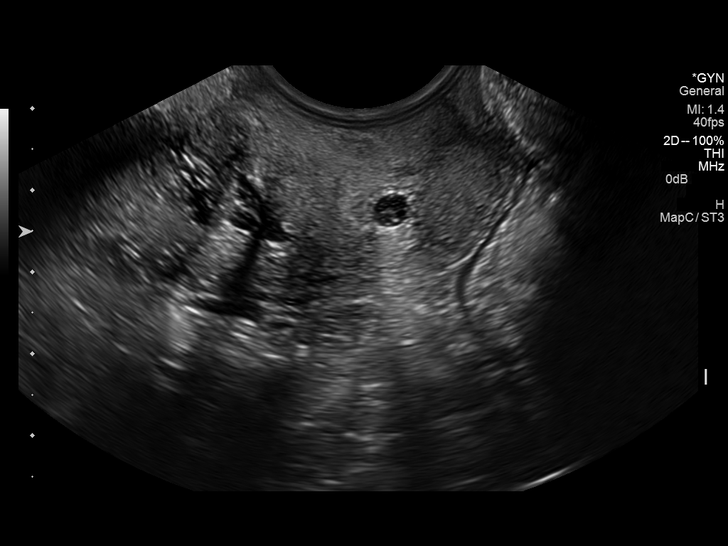
[im 44/82]
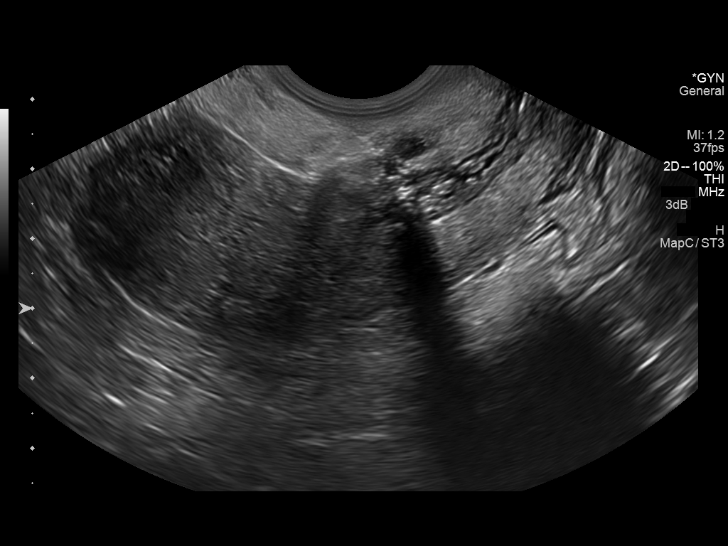
[im 51/82]
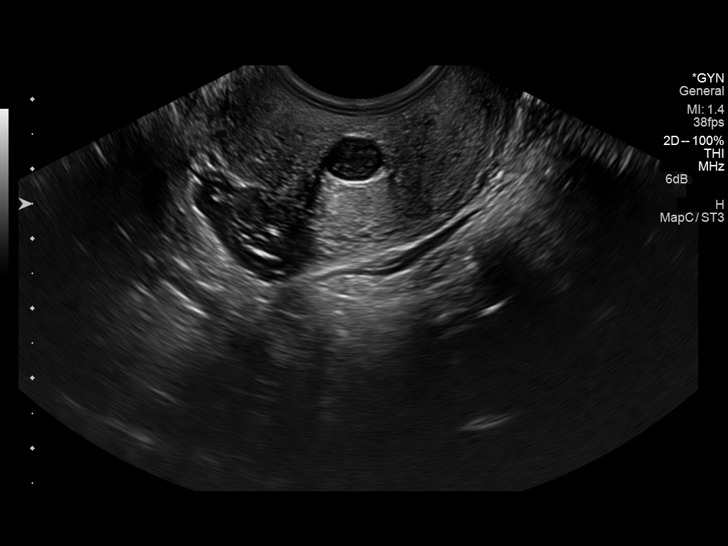
[im 55/82]
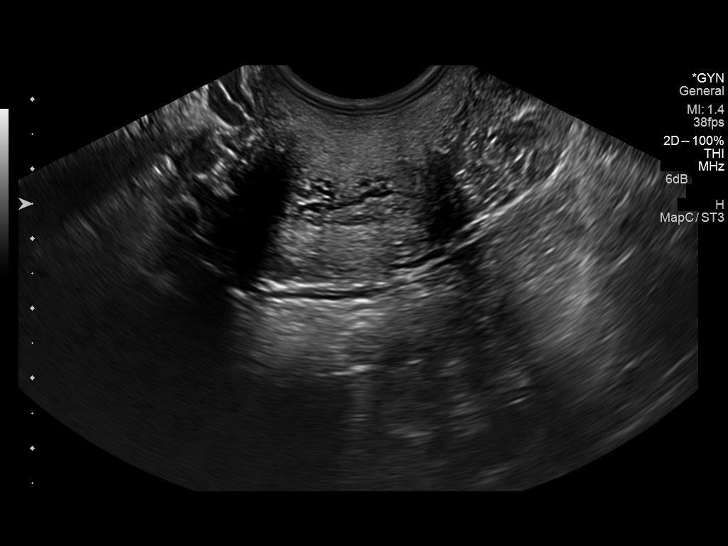
[im 61/82]
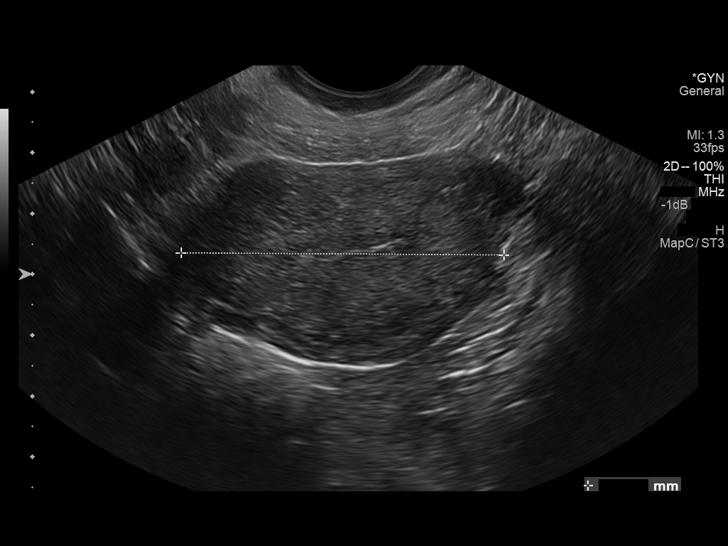
[im 68/82]
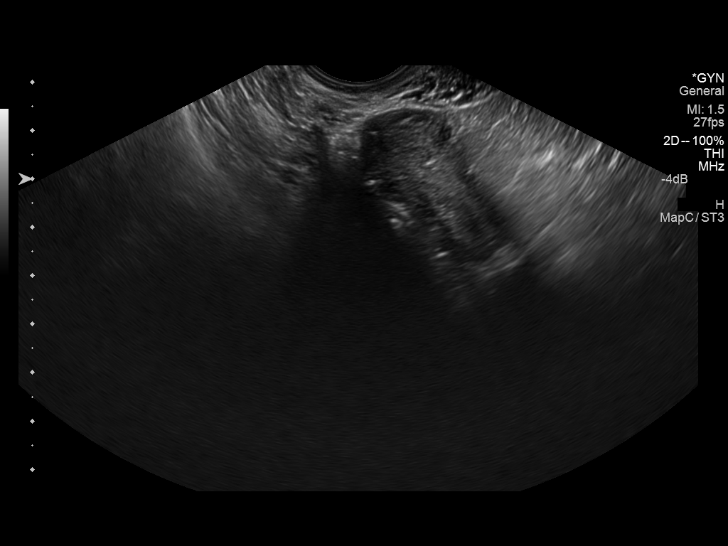
[im 75/82]
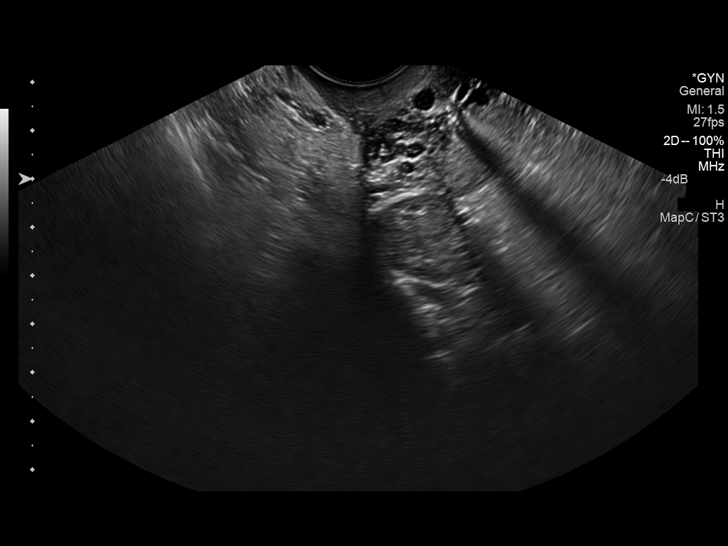
[im 82/82]
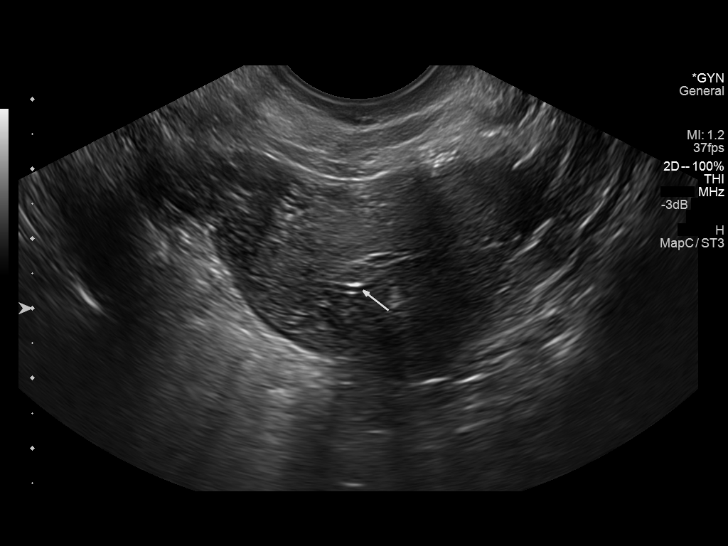

[14 of 25 positions shown; findings below may reference images not displayed]

FINDINGS: Uterus

Measurements: 9.2 x 3.9 x 5.3 cm. No fibroids or other mass
visualized.

Endometrium

Thickness: 6 mm. An IUD is appreciated within the endometrial canal
in the fundal portion of the uterus.

Right ovary

Measurements: 3.9 x 2.3 x 3.2 cm. Normal appearance/no adnexal mass.

Left ovary

Measurements: 2.8 x 1.9 x 2.8 cm. Normal appearance/no adnexal mass.

Other findings

Incidental notes made of multiple nabothian cysts within the lower
uterine segment/cervix region.
IMPRESSION: 1. An IUD is appreciated within the fundal portion of the
endometrial canal
2. Otherwise unremarkable pelvic ultrasound.

## 2014-11-09 ENCOUNTER — Encounter: Payer: Self-pay | Admitting: Adult Health

## 2014-11-09 ENCOUNTER — Ambulatory Visit (INDEPENDENT_AMBULATORY_CARE_PROVIDER_SITE_OTHER): Payer: 59 | Admitting: Adult Health

## 2014-11-09 VITALS — BP 120/72 | HR 101 | Ht 63.0 in | Wt 154.0 lb

## 2014-11-09 DIAGNOSIS — N921 Excessive and frequent menstruation with irregular cycle: Secondary | ICD-10-CM | POA: Insufficient documentation

## 2014-11-09 DIAGNOSIS — N898 Other specified noninflammatory disorders of vagina: Secondary | ICD-10-CM | POA: Diagnosis not present

## 2014-11-09 HISTORY — DX: Excessive and frequent menstruation with irregular cycle: N92.1

## 2014-11-09 LAB — POCT WET PREP (WET MOUNT)
Trichomonas Wet Prep HPF POC: NEGATIVE
WBC, Wet Prep HPF POC: POSITIVE

## 2014-11-09 MED ORDER — NYSTATIN-TRIAMCINOLONE 100000-0.1 UNIT/GM-% EX CREA: 1.0000 "application " | TOPICAL_CREAM | Freq: Two times a day (BID) | CUTANEOUS | Status: DC

## 2014-11-09 NOTE — Patient Instructions (Signed)
Use mycolog cream 2-3 x daily Continue OCs Follow up prn

## 2014-11-09 NOTE — Progress Notes (Signed)
Subjective:     Patient ID: Kristina Strickland, female   DOB: 07/09/1985, 29 y.o.   MRN: [id number]  HPI Pamelia is a 29 year old white female in complaining of irregular bleeding, started on 2nd row of pills, now its black and has discharge and itching on outside. Has lost about 20 lbs on adipex.  Review of Systems  Patient denies any headaches, hearing loss, fatigue, blurred vision, shortness of breath, chest pain, abdominal pain, problems with bowel movements, urination, or intercourse. No joint pain or mood swings. See HPI for positives.  Reviewed past medical,surgical, social and family history. Reviewed medications and allergies.     Objective:   Physical Exam BP 120/72 mmHg  Pulse 101  Ht 5\' 3"  (1.6 m)  Wt 154 lb (69.854 kg)  BMI 27.29 kg/m2  LMP 10/18/2014 (Exact Date) Skin warm and dry.Pelvic: external genitalia is normal in appearance no lesions, has linear crack near clitoris , vagina: dark brown discharge without odor,urethra has no lesions or masses noted, cervix:smooth and bulbous, uterus: normal size, shape and contour, non tender, no masses felt, adnexa: no masses or tenderness noted. Bladder is non tender and no masses felt. Wet prep: +WBC and +RBCs.Since this is the first time of having BTB will watch for now.     Assessment:     Vaginal discharge Irregular BTB    Plan:    Rx mytrex cream use 2-3 x daily prn with 1 refill Continue OCs Follow up prn

## 2014-11-26 ENCOUNTER — Encounter (HOSPITAL_COMMUNITY): Payer: Self-pay | Admitting: Emergency Medicine

## 2014-11-26 ENCOUNTER — Emergency Department (HOSPITAL_COMMUNITY)
Admission: EM | Admit: 2014-11-26 | Discharge: 2014-11-26 | Disposition: A | Discharge status: Home / Self Care | Payer: 59 | Attending: Emergency Medicine | Admitting: Emergency Medicine

## 2014-11-26 DIAGNOSIS — R102 Pelvic and perineal pain: Secondary | ICD-10-CM | POA: Diagnosis present

## 2014-11-26 DIAGNOSIS — Z880 Allergy status to penicillin: Secondary | ICD-10-CM | POA: Diagnosis not present

## 2014-11-26 DIAGNOSIS — Z87891 Personal history of nicotine dependence: Secondary | ICD-10-CM | POA: Insufficient documentation

## 2014-11-26 DIAGNOSIS — N730 Acute parametritis and pelvic cellulitis: Secondary | ICD-10-CM

## 2014-11-26 DIAGNOSIS — Z793 Long term (current) use of hormonal contraceptives: Secondary | ICD-10-CM | POA: Diagnosis not present

## 2014-11-26 DIAGNOSIS — Q618 Other cystic kidney diseases: Secondary | ICD-10-CM | POA: Insufficient documentation

## 2014-11-26 DIAGNOSIS — G43909 Migraine, unspecified, not intractable, without status migrainosus: Secondary | ICD-10-CM | POA: Insufficient documentation

## 2014-11-26 DIAGNOSIS — Z87442 Personal history of urinary calculi: Secondary | ICD-10-CM | POA: Insufficient documentation

## 2014-11-26 DIAGNOSIS — I10 Essential (primary) hypertension: Secondary | ICD-10-CM | POA: Insufficient documentation

## 2014-11-26 DIAGNOSIS — Z79899 Other long term (current) drug therapy: Secondary | ICD-10-CM | POA: Diagnosis not present

## 2014-11-26 DIAGNOSIS — Z8619 Personal history of other infectious and parasitic diseases: Secondary | ICD-10-CM | POA: Diagnosis not present

## 2014-11-26 DIAGNOSIS — Z3202 Encounter for pregnancy test, result negative: Secondary | ICD-10-CM | POA: Diagnosis not present

## 2014-11-26 LAB — URINALYSIS, ROUTINE W REFLEX MICROSCOPIC
BILIRUBIN URINE: NEGATIVE
Glucose, UA: NEGATIVE mg/dL
HGB URINE DIPSTICK: NEGATIVE
Ketones, ur: NEGATIVE mg/dL
Leukocytes, UA: NEGATIVE
NITRITE: NEGATIVE
PH: 8.5 — AB (ref 5.0–8.0)
Protein, ur: NEGATIVE mg/dL
Specific Gravity, Urine: 1.015 (ref 1.005–1.030)
UROBILINOGEN UA: 2 mg/dL — AB (ref 0.0–1.0)

## 2014-11-26 LAB — WET PREP, GENITAL
Clue Cells Wet Prep HPF POC: NONE SEEN
Trich, Wet Prep: NONE SEEN
Yeast Wet Prep HPF POC: NONE SEEN

## 2014-11-26 LAB — PREGNANCY, URINE: Preg Test, Ur: NEGATIVE

## 2014-11-26 MED ORDER — DOXYCYCLINE HYCLATE 100 MG PO CAPS: 100.0000 mg | ORAL_CAPSULE | Freq: Two times a day (BID) | ORAL | Status: DC

## 2014-11-26 MED ORDER — CEFTRIAXONE SODIUM 250 MG IJ SOLR
250.0000 mg | Freq: Once | INTRAMUSCULAR | Status: AC
  Administered 2014-11-26: 250 mg via INTRAMUSCULAR
  Filled 2014-11-26: qty 250

## 2014-11-26 MED ORDER — HYDROCODONE-ACETAMINOPHEN 5-325 MG PO TABS: 1.0000 | ORAL_TABLET | ORAL | Status: DC | PRN

## 2014-11-26 MED ORDER — LIDOCAINE HCL (PF) 1 % IJ SOLN
INTRAMUSCULAR | Status: AC
  Administered 2014-11-26: 1.2 mL
  Filled 2014-11-26: qty 5

## 2014-11-26 NOTE — ED Provider Notes (Signed)
CSN: [id number]     Arrival date & time 11/26/14  1718 History   First MD Initiated Contact with Patient 11/26/14 1735     Chief Complaint  Patient presents with  . Pelvic Pain     (Consider location/radiation/quality/duration/timing/severity/associated sxs/prior Treatment) HPI   Kristina Strickland is a 29 y.o. female who presents for evaluation of abdominal bloating, and lower back discomfort. She feels this is related to a problem that she has had with abnormal vaginal bleeding. She has been on oral contraception is chronically and did well. In total, the last cycle. She states that she bled from week 1 through 3, "like a menstrual period.", and stopped during the final week, of the pill pack. She has not previously had problems like this. She saw her gynecologist 2 weeks ago, and had a discussion with them about the bleeding. She did not have a pelvic examination or imaging at that time. She denies fever, chills, nausea, vomiting, weakness or dizziness. She works 2 different jobs. She does not currently have pain with intercourse. There is no vaginal bleeding or vaginal discharge at this time. She has not had this problem previously. There are no other known modifying factors.   Past Medical History  Diagnosis Date  . Hypertension   . HPV (human papilloma virus) infection   . Sponge kidney   . History of chicken pox   . Rubella immune   . Migraines   . Contraceptive management 08/11/2013  . Kidney stones   . Vaginal discharge 06/16/2014  . Yeast infection of the vagina 06/16/2014  . Vaginal Pap smear, abnormal   . Irregular intermenstrual bleeding 11/09/2014   Past Surgical History  Procedure Laterality Date  . Appendectomy    . Cesarean section     Family History  Problem Relation Age of Onset  . Hypertension Mother   . Migraines Mother   . Hypertension Father   . Migraines Maternal Grandmother   . Hypertension Maternal Grandfather   . Hypertension Paternal Grandfather   . Kidney  Stones Brother    Social History  Substance Use Topics  . Smoking status: Former Smoker    Types: Cigarettes  . Smokeless tobacco: Never Used  . Alcohol Use: No   OB History    Gravida Para Term Preterm AB TAB SAB Ectopic Multiple Living   Review of Systems  All other systems reviewed and are negative.     Allergies  Metronidazole and Penicillins  Home Medications   Prior to Admission medications   Medication Sig Start Date End Date Taking? Authorizing Provider  butalbital-acetaminophen-caffeine (FIORICET, ESGIC) 50-325-40 MG per tablet Take 1-2 tablets as needed for migraine. May repeat in 6 hours 09/10/14   Tiffany A Gann, PA-C  ibuprofen (ADVIL,MOTRIN) 200 MG tablet Take 400 mg by mouth every 6 (six) hours as needed for mild pain or moderate pain.    Historical Provider, MD  Norgestimate-Ethinyl Estradiol Triphasic 0.18/0.215/0.25 MG-35 MCG tablet Take 1 tablet by mouth daily. 06/16/14   Adline Potter, NP  nystatin-triamcinolone (MYCOLOG II) cream Apply 1 application topically 2 (two) times daily. 11/09/14   Adline Potter, NP  olmesartan (BENICAR) 20 MG tablet Take 1 tablet (20 mg total) by mouth daily. 09/10/14   Tiffany A Gann, PA-C  phentermine (ADIPEX-P) 37.5 MG tablet Take 1 tablet (37.5 mg total) by mouth daily before breakfast. 1/2 tablet PO in the afternoon 10/20/14  Tiffany A Gann, PA-C   BP 132/88 mmHg  Pulse 93  Temp(Src) 98.3 F (36.8 C) (Oral)  Resp 18  Ht 5\' 3"  (1.6 m)  Wt 152 lb (68.947 kg)  BMI 26.93 kg/m2  SpO2 100%  LMP 10/17/2014 (Approximate) Physical Exam  Constitutional: She is oriented to person, place, and time. She appears well-developed and well-nourished. No distress.  HENT:  Head: Normocephalic and atraumatic.  Right Ear: External ear normal.  Left Ear: External ear normal.  Eyes: Conjunctivae and EOM are normal. Pupils are equal, round, and reactive to light.  Neck: Normal range of motion and phonation normal.  Neck supple.  Cardiovascular: Normal rate, regular rhythm and normal heart sounds.   Pulmonary/Chest: Effort normal and breath sounds normal. She exhibits no bony tenderness.  Abdominal: Soft. She exhibits no distension and no mass. There is no tenderness. There is no guarding.  Abdominal examination is benign  Genitourinary:  Normal external female genitalia. Thick green discharge in the vagina, which seems to be emanating from the cervical os. There is mild cervical motion tenderness, but no severe pain with palpation of the cervix. No right adnexal tenderness or mass. Mild left adnexal tenderness, without mass. Uterus is involuted.  Musculoskeletal: Normal range of motion.  Neurological: She is alert and oriented to person, place, and time. No cranial nerve deficit or sensory deficit. She exhibits normal muscle tone. Coordination normal.  Skin: Skin is warm, dry and intact.  Psychiatric: She has a normal mood and affect. Her behavior is normal. Judgment and thought content normal.  Nursing note and vitals reviewed.   ED Course  Procedures (including critical care time)  Medications - No data to display  Patient Vitals for the past 24 hrs:  BP Temp Temp src Pulse Resp SpO2 Height Weight  11/26/14 1728 132/88 mmHg 98.3 F (36.8 C) Oral 93 18 100 % 5\' 3"  (1.6 m) 152 lb (68.947 kg)    7:30 PM Reevaluation with update and discussion. After initial assessment and treatment, an updated evaluation reveals she remains comfortable. She requests to be treated for "PID". She prefers to follow-up with her gynecologist, next week for a checkup, and possible ultrasound at that time if indicated. Findings discussed with patient and husband, all questions answered.[name]    Labs Review Labs Reviewed  WET PREP, GENITAL  RPR  HIV ANTIBODY (ROUTINE TESTING)  GC/CHLAMYDIA PROBE AMP ([place]) NOT AT [facility]    Imaging Review No results found. I, Arney Mayabb L, personally reviewed and  evaluated these images and lab results as part of my medical decision-making.   EKG Interpretation None      MDM   Final diagnoses:  PID (acute pelvic inflammatory disease)    Nonspecific pelvic pain, with irregular menses. Possibility of ovarian cysts as causative factor exists. Nonspecific vaginal discharge, on clinical evaluation. Patient requested treatment for PID. Since she had previously been treated for it before. STD testing results are pending. No evidence for nonspecific vaginitis. Doubt TOA, ruptured ovarian cyst, severe PID, or impending vascular collapse.  Nursing Notes Reviewed/ Care Coordinated Applicable Imaging Reviewed Interpretation of Laboratory Data incorporated into ED treatment  The patient appears reasonably screened and/or stabilized for discharge and I doubt any other medical condition or other [facility] requiring further screening, evaluation, or treatment in the ED at this time prior to discharge.  Plan: Home Medications- Doxy. X 10 days; Home Treatments- rest; return here if the recommended treatment, does not improve the symptoms; Recommended follow up-  GYN 4-5 days for check up     Mancel Bale, MD 11/26/14 2012

## 2014-11-26 NOTE — ED Notes (Signed)
Phlebotomy at bedside.

## 2014-11-26 NOTE — Discharge Instructions (Signed)
Pelvic Inflammatory Disease °Pelvic inflammatory disease (PID) refers to an infection in some or all of the female organs. The infection can be in the uterus, ovaries, fallopian tubes, or the surrounding tissues in the pelvis. PID can cause abdominal or pelvic pain that comes on suddenly (acute pelvic pain). PID is a serious infection because it can lead to lasting (chronic) pelvic pain or the inability to have children (infertile).  °CAUSES  °The infection is often caused by the normal bacteria found in the vaginal tissues. PID may also be caused by an infection that is spread during sexual contact. PID can also occur following:  °· The birth of a baby.   °· A miscarriage.   °· An abortion.   °· Major pelvic surgery.   °· The use of an intrauterine device (IUD).   °· A sexual assault.   °RISK FACTORS °Certain factors can put a person at higher risk for PID, such as: °· Being younger than 25 years. °· Being sexually active at a young age. °· Using nonbarrier contraception. °· Having multiple sexual partners. °· Having sex with someone who has symptoms of a genital infection. °· Using oral contraception. °Other times, certain behaviors can increase the possibility of getting PID, such as: °· Having sex during your period. °· Using a vaginal douche. °· Having an intrauterine device (IUD) in place. °SYMPTOMS  °· Abdominal or pelvic pain.   °· Fever.   °· Chills.   °· Abnormal vaginal discharge. °· Abnormal uterine bleeding.   °· Unusual pain shortly after finishing your period. °DIAGNOSIS  °Your caregiver will choose some of the following methods to make a diagnosis, such as:  °· Performing a physical exam and history. A pelvic exam typically reveals a very tender uterus and surrounding pelvis.   °· Ordering laboratory tests including a pregnancy test, blood tests, and urine test.  °· Ordering cultures of the vagina and cervix to check for a sexually transmitted infection (STI). °· Performing an ultrasound.    °· Performing a laparoscopic procedure to look inside the pelvis.   °TREATMENT  °· Antibiotic medicines may be prescribed and taken by mouth.   °· Sexual partners may be treated when the infection is caused by a sexually transmitted disease (STD).   °· Hospitalization may be needed to give antibiotics intravenously. °· Surgery may be needed, but this is rare. °It may take weeks until you are completely well. If you are diagnosed with PID, you should also be checked for human immunodeficiency virus (HIV).   °HOME CARE INSTRUCTIONS  °· If given, take your antibiotics as directed. Finish the medicine even if you start to feel better.   °· Only take over-the-counter or prescription medicines for pain, discomfort, or fever as directed by your caregiver.   °· Do not have sexual intercourse until treatment is completed or as directed by your caregiver. If PID is confirmed, your recent sexual partner(s) will need treatment.   °· Keep your follow-up appointments. °SEEK MEDICAL CARE IF:  °· You have increased or abnormal vaginal discharge.   °· You need prescription medicine for your pain.   °· You vomit.   °· You cannot take your medicines.   °· Your partner has an STD.   °SEEK IMMEDIATE MEDICAL CARE IF:  °· You have a fever.   °· You have increased abdominal or pelvic pain.   °· You have chills.   °· You have pain when you urinate.   °· You are not better after 72 hours following treatment.   °MAKE SURE YOU:  °· Understand these instructions. °· Will watch your condition. °· Will get help right away if you are not doing well or get worse. °  Document Released: 04/02/2005 Document Revised: 07/28/2012 Document Reviewed: 03/29/2011 °[employer]® Patient Information ©2015 [employer]. This information is not intended to replace advice given to you by your health care provider. Make sure you discuss any questions you have with your health care provider. ° °

## 2014-11-26 NOTE — ED Notes (Signed)
Pt has lower abdo /pelvic pain states that her abdomen is distended- Has been having problems with her birth control - took a preg test yesterday that was negative .

## 2014-11-26 NOTE — ED Notes (Signed)
MD Effie Shy at bedside for pelvic exam.

## 2014-11-26 NOTE — ED Notes (Signed)
Discharge instructions given, pt demonstrated teach back and verbal understanding. No concerns voiced.  

## 2014-11-27 LAB — RPR: RPR Ser Ql: NONREACTIVE

## 2014-11-27 LAB — HIV ANTIBODY (ROUTINE TESTING W REFLEX): HIV Screen 4th Generation wRfx: NONREACTIVE

## 2014-11-29 LAB — GC/CHLAMYDIA PROBE AMP (~~LOC~~) NOT AT ARMC
Chlamydia: NEGATIVE
Neisseria Gonorrhea: NEGATIVE

## 2014-12-06 ENCOUNTER — Ambulatory Visit: Payer: 59 | Admitting: Adult Health

## 2014-12-15 ENCOUNTER — Encounter: Payer: Self-pay | Admitting: Adult Health

## 2014-12-15 ENCOUNTER — Ambulatory Visit (INDEPENDENT_AMBULATORY_CARE_PROVIDER_SITE_OTHER): Payer: 59 | Admitting: Adult Health

## 2014-12-15 VITALS — BP 148/92 | HR 80 | Ht 63.0 in | Wt 152.0 lb

## 2014-12-15 DIAGNOSIS — R102 Pelvic and perineal pain unspecified side: Secondary | ICD-10-CM

## 2014-12-15 DIAGNOSIS — N921 Excessive and frequent menstruation with irregular cycle: Secondary | ICD-10-CM

## 2014-12-15 DIAGNOSIS — K5900 Constipation, unspecified: Secondary | ICD-10-CM | POA: Diagnosis not present

## 2014-12-15 HISTORY — DX: Pelvic and perineal pain: R10.2

## 2014-12-15 HISTORY — DX: Constipation, unspecified: K59.00

## 2014-12-15 MED ORDER — DICYCLOMINE HCL 10 MG PO CAPS
10.0000 mg | ORAL_CAPSULE | Freq: Three times a day (TID) | ORAL | Status: DC
Start: 2014-12-15 — End: 2015-03-29

## 2014-12-15 MED ORDER — HYDROCODONE-ACETAMINOPHEN 7.5-325 MG PO TABS: 1.0000 | ORAL_TABLET | Freq: Four times a day (QID) | ORAL | Status: DC | PRN

## 2014-12-15 NOTE — Progress Notes (Signed)
Subjective:     Patient ID: Kristina Strickland, female   DOB: 1985-08-08, 29 y.o.   MRN: [id number]  HPI Kristina Strickland is a 29 year old white female, married in complaining of pelvic pain and BTB still,she was seen in ER 8/12 and was treated for PID, she has history of that,but discharge is gone, but has pelvic pain esp with bending or laying down and is constipated,but that is chronic.GC/CHL was negative in ER.  Review of Systems Patient denies any headaches, hearing loss, fatigue, blurred vision, shortness of breath, chest pain, problems with  urination, or intercourse. No joint pain or mood swings.See HPI for positives. Reviewed past medical,surgical, social and family history. Reviewed medications and allergies.     Objective:   Physical Exam BP 148/92 mmHg  Pulse 80  Ht 5' 3"  (1.6 m)  Wt 152 lb (68.947 kg)  BMI 26.93 kg/m2  LMP 12/04/2014 Skin warm and dry.Pelvic: external genitalia is normal in appearance no lesions, vagina: period like blood,urethra has no lesions or masses noted, cervix:smooth and bulbous,negative CMT uterus: normal size, shape and contour, non tender, no masses felt, adnexa: no masses, has  tenderness noted, across lower pelvis, LLQ >right, Bladder is non tender and no masses felt.Abdomen is soft and non tender with no HSM and good bowel sounds in all 4 quadrants.Will get labs and [place] and will give bentyl and try miralax daily,could be IBS/C.May need GI referral if not better by next week.    Assessment:    Pelvic pain  BTB Constipation     Plan:    Continue OCs  Check CBC,CMP and ESR Return in 2 days for gyn US,see me next week Rx bentyl 10 mg #30 take 1 tid ac and hs with 1 refill Rx norco 7.5-325 mg #30 take 1 every 6 hours prn pain no refills Review handout on pelvic pain Take miralax

## 2014-12-15 NOTE — Patient Instructions (Signed)
Pelvic Pain Female pelvic pain can be caused by many different things and start from a variety of places. Pelvic pain refers to pain that is located in the lower half of the abdomen and between your hips. The pain may occur over a short period of time (acute) or may be reoccurring (chronic). The cause of pelvic pain may be related to disorders affecting the female reproductive organs (gynecologic), but it may also be related to the bladder, kidney stones, an intestinal complication, or muscle or skeletal problems. Getting help right away for pelvic pain is important, especially if there has been severe, sharp, or a sudden onset of unusual pain. It is also important to get help right away because some types of pelvic pain can be life threatening.  CAUSES  Below are only some of the causes of pelvic pain. The causes of pelvic pain can be in one of several categories.   Gynecologic.  Pelvic inflammatory disease.  Sexually transmitted infection.  Ovarian cyst or a twisted ovarian ligament (ovarian torsion).  Uterine lining that grows outside the uterus (endometriosis).  Fibroids, cysts, or tumors.  Ovulation.  Pregnancy.  Pregnancy that occurs outside the uterus (ectopic pregnancy).  Miscarriage.  Labor.  Abruption of the placenta or ruptured uterus.  Infection.  Uterine infection (endometritis).  Bladder infection.  Diverticulitis.  Miscarriage related to a uterine infection (septic abortion).  Bladder.  Inflammation of the bladder (cystitis).  Kidney stone(s).  Gastrointestinal.  Constipation.  Diverticulitis.  Neurologic.  Trauma.  Feeling pelvic pain because of mental or emotional causes (psychosomatic).  Cancers of the bowel or pelvis. EVALUATION  Your caregiver will want to take a careful history of your concerns. This includes recent changes in your health, a careful gynecologic history of your periods (menses), and a sexual history. Obtaining your family  history and medical history is also important. Your caregiver may suggest a pelvic exam. A pelvic exam will help identify the location and severity of the pain. It also helps in the evaluation of which organ system may be involved. In order to identify the cause of the pelvic pain and be properly treated, your caregiver may order tests. These tests may include:   A pregnancy test.  Pelvic ultrasonography.  An X-ray exam of the abdomen.  A urinalysis or evaluation of vaginal discharge.  Blood tests. HOME CARE INSTRUCTIONS   Only take over-the-counter or prescription medicines for pain, discomfort, or fever as directed by your caregiver.   Rest as directed by your caregiver.   Eat a balanced diet.   Drink enough fluids to make your urine clear or pale yellow, or as directed.   Avoid sexual intercourse if it causes pain.   Apply warm or cold compresses to the lower abdomen depending on which one helps the pain.   Avoid stressful situations.   Keep a journal of your pelvic pain. Write down when it started, where the pain is located, and if there are things that seem to be associated with the pain, such as food or your menstrual cycle.  Follow up with your caregiver as directed.  SEEK MEDICAL CARE IF:  Your medicine does not help your pain.  You have abnormal vaginal discharge. SEEK IMMEDIATE MEDICAL CARE IF:   You have heavy bleeding from the vagina.   Your pelvic pain increases.   You feel light-headed or faint.   You have chills.   You have pain with urination or blood in your urine.   You have uncontrolled diarrhea   or vomiting.   You have a fever or persistent symptoms for more than 3 days.  You have a fever and your symptoms suddenly get worse.   You are being physically or sexually abused.  MAKE SURE YOU:  Understand these instructions.  Will watch your condition.  Will get help if you are not doing well or get worse. Document Released:  02/28/2004 Document Revised: 08/17/2013 Document Reviewed: 07/23/2011 [facility] Patient Information 2015 [place], [place]. This information is not intended to replace advice given to you by your health care provider. Make sure you discuss any questions you have with your health care provider. [place] in 2 days See me next week  Take miralax  Take bentyl

## 2014-12-16 LAB — COMPREHENSIVE METABOLIC PANEL
ALT: 15 IU/L (ref 0–32)
AST: 14 IU/L (ref 0–40)
Albumin/Globulin Ratio: 1.7 (ref 1.1–2.5)
Albumin: 4.3 g/dL (ref 3.5–5.5)
Alkaline Phosphatase: 44 IU/L (ref 39–117)
BUN/Creatinine Ratio: 17 (ref 8–20)
BUN: 12 mg/dL (ref 6–20)
Bilirubin Total: <0.2 mg/dL (ref 0.0–1.2)
CALCIUM: 9.7 mg/dL (ref 8.7–10.2)
CO2: 27 mmol/L (ref 18–29)
CREATININE: 0.71 mg/dL (ref 0.57–1.00)
Chloride: 98 mmol/L (ref 97–108)
GFR calc Af Amer: 133 mL/min/{1.73_m2} (ref >59)
GFR, EST NON AFRICAN AMERICAN: 115 mL/min/{1.73_m2} (ref >59)
GLOBULIN, TOTAL: 2.6 g/dL (ref 1.5–4.5)
Glucose: 88 mg/dL (ref 65–99)
Potassium: 4.2 mmol/L (ref 3.5–5.2)
SODIUM: 138 mmol/L (ref 134–144)
Total Protein: 6.9 g/dL (ref 6.0–8.5)

## 2014-12-16 LAB — CBC
HEMOGLOBIN: 12.9 g/dL (ref 11.1–15.9)
Hematocrit: 38.7 % (ref 34.0–46.6)
MCH: 29.5 pg (ref 26.6–33.0)
MCHC: 33.3 g/dL (ref 31.5–35.7)
MCV: 89 fL (ref 79–97)
Platelets: 284 10*3/uL (ref 150–379)
RBC: 4.37 x10E6/uL (ref 3.77–5.28)
RDW: 13.6 % (ref 12.3–15.4)
WBC: 5.4 10*3/uL (ref 3.4–10.8)

## 2014-12-16 LAB — SEDIMENTATION RATE: Sed Rate: 2 mm/hr (ref 0–32)

## 2014-12-17 ENCOUNTER — Encounter: Payer: Self-pay | Admitting: Obstetrics & Gynecology

## 2014-12-17 ENCOUNTER — Other Ambulatory Visit: Payer: 59

## 2014-12-21 ENCOUNTER — Telehealth: Payer: Self-pay | Admitting: Adult Health

## 2014-12-21 NOTE — Telephone Encounter (Signed)
Pt aware labs normal, she had to cancel [place] due to finances, well reschedule later

## 2015-03-18 ENCOUNTER — Ambulatory Visit: Payer: 59 | Admitting: Family Medicine

## 2015-03-29 ENCOUNTER — Ambulatory Visit (INDEPENDENT_AMBULATORY_CARE_PROVIDER_SITE_OTHER): Payer: 59 | Admitting: Family Medicine

## 2015-03-29 ENCOUNTER — Encounter: Payer: Self-pay | Admitting: Family Medicine

## 2015-03-29 VITALS — BP 136/86 | HR 91 | Temp 99.0°F | Ht 63.0 in | Wt 159.0 lb

## 2015-03-29 DIAGNOSIS — J209 Acute bronchitis, unspecified: Secondary | ICD-10-CM | POA: Diagnosis not present

## 2015-03-29 MED ORDER — AZITHROMYCIN 250 MG PO TABS
ORAL_TABLET | ORAL | Status: DC
Start: 2015-03-29 — End: 2015-06-13

## 2015-03-29 NOTE — Progress Notes (Signed)
BP 136/86 mmHg  Pulse 91  Temp(Src) 99 F (37.2 C) (Oral)  Ht 5\' 3"  (1.6 m)  Wt 159 lb (72.122 kg)  BMI 28.17 kg/m2  LMP 03/22/2015 (Approximate)   Subjective:    Patient ID: Kristina Strickland, female    DOB: 1985/05/06, 29 y.o.   MRN: [id number]  HPI: Kristina Strickland is a 29 y.o. female presenting on 03/29/2015 for Fever, vomited x 1 yesterday, glands feel swollen in neck, s and Bronchitis   HPI Cough and chest congestion Patient has had cough and chest congestion and sinus pressure and postnasal drainage for the past 2 weeks. She felt like it was getting better while using the Mucinex and a homemade cough remedy but then over the past few days it started to come back in her sinuses and down in her chest. She feels like her chest is congested and she has trouble breathing as if she was smoking again and she has not been smoking. She denies any fevers but admits to chills. Her cough is nonproductive. She denies any pressure or ears.  Relevant past medical, surgical, family and social history reviewed and updated as indicated. Interim medical history since our last visit reviewed. Allergies and medications reviewed and updated.  Review of Systems  Constitutional: Positive for chills. Negative for fever.  HENT: Positive for congestion, postnasal drip, rhinorrhea, sinus pressure and sore throat. Negative for ear discharge, ear pain and sneezing.   Eyes: Negative for pain, redness and visual disturbance.  Respiratory: Positive for cough. Negative for chest tightness, shortness of breath and wheezing.   Cardiovascular: Negative for chest pain, palpitations and leg swelling.  Genitourinary: Negative for dysuria and difficulty urinating.  Musculoskeletal: Negative for back pain and gait problem.  Skin: Negative for rash.  Neurological: Negative for light-headedness and headaches.  Psychiatric/Behavioral: Negative for behavioral problems and agitation.  All other systems reviewed and are  negative.   Per HPI unless specifically indicated above     Medication List       This list is accurate as of: 03/29/15  8:43 AM.  Always use your most recent med list.               azithromycin 250 MG tablet  Commonly known as:  ZITHROMAX  Take 2 the first day and then one each day after.     ibuprofen 200 MG tablet  Commonly known as:  ADVIL,MOTRIN  Take 400 mg by mouth every 6 (six) hours as needed for mild pain or moderate pain.     Norgestimate-Ethinyl Estradiol Triphasic 0.18/0.215/0.25 MG-35 MCG tablet  Take 1 tablet by mouth daily.     olmesartan 20 MG tablet  Commonly known as:  BENICAR  Take 1 tablet (20 mg total) by mouth daily.           Objective:    BP 136/86 mmHg  Pulse 91  Temp(Src) 99 F (37.2 C) (Oral)  Ht 5\' 3"  (1.6 m)  Wt 159 lb (72.122 kg)  BMI 28.17 kg/m2  LMP 03/22/2015 (Approximate)  Wt Readings from Last 3 Encounters:  03/29/15 159 lb (72.122 kg)  12/15/14 152 lb (68.947 kg)  11/26/14 152 lb (68.947 kg)    Physical Exam  Constitutional: She is oriented to person, place, and time. She appears well-developed and well-nourished. No distress.  HENT:  Right Ear: Tympanic membrane, external ear and ear canal normal.  Left Ear: Tympanic membrane, external ear and ear canal normal.  Nose: Mucosal edema and rhinorrhea present.  No epistaxis. Right sinus exhibits maxillary sinus tenderness and frontal sinus tenderness. Left sinus exhibits maxillary sinus tenderness and frontal sinus tenderness.  Mouth/Throat: Uvula is midline and mucous membranes are normal. Posterior oropharyngeal edema and posterior oropharyngeal erythema present. No oropharyngeal exudate or tonsillar abscesses.  Eyes: Conjunctivae and EOM are normal.  Neck: Neck supple. No thyromegaly present.  Cardiovascular: Normal rate, regular rhythm, normal heart sounds and intact distal pulses.   No murmur heard. Pulmonary/Chest: Effort normal and breath sounds normal. No  respiratory distress. She has no wheezes. She has no rales.  Musculoskeletal: Normal range of motion. She exhibits no edema or tenderness.  Lymphadenopathy:    She has no cervical adenopathy.  Neurological: She is alert and oriented to person, place, and time. Coordination normal.  Skin: Skin is warm and dry. No rash noted. She is not diaphoretic.  Psychiatric: She has a normal mood and affect. Her behavior is normal.  Vitals reviewed.   Results for orders placed or performed in visit on 12/15/14  CBC  Result Value Ref Range   WBC 5.4 3.4 - 10.8 x10E3/uL   RBC 4.37 3.77 - 5.28 x10E6/uL   Hemoglobin 12.9 11.1 - 15.9 g/dL   Hematocrit 09.8 11.9 - 46.6 %   MCV 89 79 - 97 fL   MCH 29.5 26.6 - 33.0 pg   MCHC 33.3 31.5 - 35.7 g/dL   RDW 14.7 82.9 - 56.2 %   Platelets 284 150 - 379 x10E3/uL  Comprehensive metabolic panel  Result Value Ref Range   Glucose 88 65 - 99 mg/dL   BUN 12 6 - 20 mg/dL   Creatinine, Ser 1.30 0.57 - 1.00 mg/dL   GFR calc non Af Amer 115 >59 mL/min/1.73   GFR calc Af Amer 133 >59 mL/min/1.73   BUN/Creatinine Ratio 17 8 - 20   Sodium 138 134 - 144 mmol/L   Potassium 4.2 3.5 - 5.2 mmol/L   Chloride 98 97 - 108 mmol/L   CO2 27 18 - 29 mmol/L   Calcium 9.7 8.7 - 10.2 mg/dL   Total Protein 6.9 6.0 - 8.5 g/dL   Albumin 4.3 3.5 - 5.5 g/dL   Globulin, Total 2.6 1.5 - 4.5 g/dL   Albumin/Globulin Ratio 1.7 1.1 - 2.5   Bilirubin Total <0.2 0.0 - 1.2 mg/dL   Alkaline Phosphatase 44 39 - 117 IU/L   AST 14 0 - 40 IU/L   ALT 15 0 - 32 IU/L  Sedimentation rate  Result Value Ref Range   Sed Rate 2 0 - 32 mm/hr      Assessment & Plan:       Problem List Items Addressed This Visit    None    Visit Diagnoses    Acute bronchitis, unspecified organism    -  Primary    Been going on for 2 weeks and has tried multiple over-the-counter medications appropriately. We'll send a Zithromax    Relevant Medications    azithromycin (ZITHROMAX) 250 MG tablet        Follow  up plan: Return if symptoms worsen or fail to improve.  Counseling provided for all of the vaccine components No orders of the defined types were placed in this encounter.    Arville Care, MD [facility] 03/29/2015, 8:43 AM

## 2015-05-31 ENCOUNTER — Ambulatory Visit: Payer: Self-pay | Admitting: Adult Health

## 2015-06-13 ENCOUNTER — Ambulatory Visit (INDEPENDENT_AMBULATORY_CARE_PROVIDER_SITE_OTHER): Payer: BLUE CROSS/BLUE SHIELD | Admitting: Family

## 2015-06-13 ENCOUNTER — Encounter: Payer: Self-pay | Admitting: Family

## 2015-06-13 VITALS — BP 124/89 | HR 96 | Temp 98.2°F | Ht 63.0 in | Wt 161.0 lb

## 2015-06-13 DIAGNOSIS — K5900 Constipation, unspecified: Secondary | ICD-10-CM

## 2015-06-13 DIAGNOSIS — I10 Essential (primary) hypertension: Secondary | ICD-10-CM

## 2015-06-13 DIAGNOSIS — F329 Major depressive disorder, single episode, unspecified: Secondary | ICD-10-CM | POA: Diagnosis not present

## 2015-06-13 DIAGNOSIS — F411 Generalized anxiety disorder: Secondary | ICD-10-CM | POA: Diagnosis not present

## 2015-06-13 DIAGNOSIS — G43109 Migraine with aura, not intractable, without status migrainosus: Secondary | ICD-10-CM | POA: Insufficient documentation

## 2015-06-13 DIAGNOSIS — F32A Depression, unspecified: Secondary | ICD-10-CM | POA: Insufficient documentation

## 2015-06-13 MED ORDER — TOPIRAMATE 25 MG PO TABS: 50.0000 mg | ORAL_TABLET | Freq: Two times a day (BID) | ORAL | Status: DC

## 2015-06-13 MED ORDER — ESCITALOPRAM OXALATE 10 MG PO TABS: 10.0000 mg | ORAL_TABLET | Freq: Every day | ORAL | Status: DC

## 2015-06-13 NOTE — Progress Notes (Signed)
Subjective:    Patient ID: Kristina Strickland, female    DOB: Jan 27, 1986, 30 y.o.   MRN: [id number]  Depression      The patient presents with depression.  This is a new problem.  The current episode started more than 1 year ago.   The onset quality is gradual.   The problem occurs constantly.  The problem has been waxing and waning since onset.  Associated symptoms include irritable, restlessness, decreased interest, headaches and sad.  Associated symptoms include no helplessness, no hopelessness and no suicidal ideas.     The symptoms are aggravated by nothing.  Past treatments include nothing.  Past medical history includes anxiety and depression.   Headache  This is a recurrent problem. The current episode started more than 1 month ago. The problem occurs intermittently. The problem has been unchanged. The pain is located in the frontal region. The pain quality is similar to prior headaches. The quality of the pain is described as aching. The pain is at a severity of 6/10. The pain is moderate. Associated symptoms include blurred vision, nausea, phonophobia, photophobia and vomiting. The symptoms are aggravated by activity and fatigue. She has tried acetaminophen and NSAIDs for the symptoms. The treatment provided mild relief. Her past medical history is significant for hypertension.  Hypertension This is a chronic problem. The current episode started more than 1 year ago. The problem has been resolved since onset. The problem is controlled. Associated symptoms include anxiety, blurred vision, headaches and palpitations. Pertinent negatives include no shortness of breath.  Anxiety Presents for initial visit. Onset was 1 to 5 years ago. The problem has been waxing and waning. Symptoms include depressed mood, excessive worry, irritability, nausea, nervous/anxious behavior, palpitations, panic and restlessness. Patient reports no shortness of breath or suicidal ideas. Symptoms occur most days. The severity  of symptoms is moderate. The symptoms are aggravated by family issues. Nighttime awakenings: none.   There are no known risk factors. Her past medical history is significant for anxiety/panic attacks and depression. Past treatments include nothing. The treatment provided no relief.      Review of Systems  Constitutional: Positive for irritability.  Eyes: Positive for blurred vision and photophobia.  Respiratory: Negative.  Negative for shortness of breath.   Cardiovascular: Positive for palpitations.  Gastrointestinal: Positive for nausea and vomiting.  Endocrine: Negative.   Genitourinary: Negative.   Musculoskeletal: Negative.   Neurological: Positive for headaches.  Hematological: Negative.   Psychiatric/Behavioral: Positive for depression. Negative for suicidal ideas. The patient is nervous/anxious.   All other systems reviewed and are negative.      Objective:   Physical Exam  Constitutional: She is oriented to person, place, and time. She appears well-developed and well-nourished. She is irritable. No distress.  HENT:  Head: Normocephalic and atraumatic.  Right Ear: External ear normal.  Left Ear: External ear normal.  Nose: Nose normal.  Mouth/Throat: Oropharynx is clear and moist.  Eyes: Pupils are equal, round, and reactive to light.  Neck: Normal range of motion. Neck supple. No thyromegaly present.  Cardiovascular: Normal rate, regular rhythm, normal heart sounds and intact distal pulses.   No murmur heard. Pulmonary/Chest: Effort normal and breath sounds normal. No respiratory distress. She has no wheezes.  Abdominal: Soft. Bowel sounds are normal. She exhibits no distension. There is no tenderness.  Musculoskeletal: Normal range of motion. She exhibits no edema or tenderness.  Neurological: She is alert and oriented to person, place, and time. She has normal reflexes. No  cranial nerve deficit.  Skin: Skin is warm and dry.  Psychiatric: She has a normal mood and  affect. Her behavior is normal. Judgment and thought content normal.  Vitals reviewed.     BP 124/89 mmHg  Pulse 96  Temp(Src) 98.2 F (36.8 C) (Oral)  Ht 5\' 3"  (1.6 m)  Wt 161 lb (73.029 kg)  BMI 28.53 kg/m2     Assessment & Plan:  1. Essential hypertension -Resolved Will hold off on medicaitons  2. Constipation, unspecified constipation type   3. GAD (generalized anxiety disorder) -Pt started on Lexapro 10 mg today -Stress management discussed - escitalopram (LEXAPRO) 10 MG tablet; Take 1 tablet (10 mg total) by mouth daily.  Dispense: 30 tablet; Refill: 5  4. Depression -Pt started on Lexapro 10 mg today -Stress management discussed - escitalopram (LEXAPRO) 10 MG tablet; Take 1 tablet (10 mg total) by mouth daily.  Dispense: 30 tablet; Refill: 5  5. Migraine with aura and without status migrainosus, not intractable -PT started on topamax 25 mg BID- Pt told after one week to increase to 50 mg BID -Avoid stress and caffeine -RTO in 4 weeks - topiramate (TOPAMAX) 25 MG tablet; Take 2 tablets (50 mg total) by mouth 2 (two) times daily.  Dispense: 120 tablet; Refill: 1   Continue all meds Labs pending Health Maintenance reviewed Diet and exercise encouraged RTO 4 weeks  Jannifer Rodney, FNP

## 2015-06-13 NOTE — Patient Instructions (Signed)
Major Depressive Disorder Major depressive disorder is a mental illness. It also may be called clinical depression or unipolar depression. Major depressive disorder usually causes feelings of sadness, hopelessness, or helplessness. Some people with this disorder do not feel particularly sad but lose interest in doing things they used to enjoy (anhedonia). Major depressive disorder also can cause physical symptoms. It can interfere with work, school, relationships, and other normal everyday activities. The disorder varies in severity but is longer lasting and more serious than the sadness we all feel from time to time in our lives. Major depressive disorder often is triggered by stressful life events or major life changes. Examples of these triggers include divorce, loss of your job or home, a move, and the death of a family member or close friend. Sometimes this disorder occurs for no obvious reason at all. People who have family members with major depressive disorder or bipolar disorder are at higher risk for developing this disorder, with or without life stressors. Major depressive disorder can occur at any age. It may occur just once in your life (single episode major depressive disorder). It may occur multiple times (recurrent major depressive disorder). SYMPTOMS People with major depressive disorder have either anhedonia or depressed mood on nearly a daily basis for at least 2 weeks or longer. Symptoms of depressed mood include:  Feelings of sadness (blue or down in the dumps) or emptiness.  Feelings of hopelessness or helplessness.  Tearfulness or episodes of crying (may be observed by others).  Irritability (children and adolescents). In addition to depressed mood or anhedonia or both, people with this disorder have at least four of the following symptoms:  Difficulty sleeping or sleeping too much.   Significant change (increase or decrease) in appetite or weight.   Lack of energy or  motivation.  Feelings of guilt and worthlessness.   Difficulty concentrating, remembering, or making decisions.  Unusually slow movement (psychomotor retardation) or restlessness (as observed by others).   Recurrent wishes for death, recurrent thoughts of self-harm (suicide), or a suicide attempt. People with major depressive disorder commonly have persistent negative thoughts about themselves, other people, and the world. People with severe major depressive disorder may experiencedistorted beliefs or perceptions about the world (psychotic delusions). They also may see or hear things that are not real (psychotic hallucinations). DIAGNOSIS Major depressive disorder is diagnosed through an assessment by your health care provider. Your health care provider will ask aboutaspects of your daily life, such as mood,sleep, and appetite, to see if you have the diagnostic symptoms of major depressive disorder. Your health care provider may ask about your medical history and use of alcohol or drugs, including prescription medicines. Your health care provider also may do a physical exam and blood work. This is because certain medical conditions and the use of certain substances can cause major depressive disorder-like symptoms (secondary depression). Your health care provider also may refer you to a mental health specialist for further evaluation and treatment. TREATMENT It is important to recognize the symptoms of major depressive disorder and seek treatment. The following treatments can be prescribed for this disorder:   Medicine. Antidepressant medicines usually are prescribed. Antidepressant medicines are thought to correct chemical imbalances in the brain that are commonly associated with major depressive disorder. Other types of medicine may be added if the symptoms do not respond to antidepressant medicines alone or if psychotic delusions or hallucinations occur.  Talk therapy. Talk therapy can be  helpful in treating major depressive disorder by providing   support, education, and guidance. Certain types of talk therapy also can help with negative thinking (cognitive behavioral therapy) and with relationship issues that trigger this disorder (interpersonal therapy). A mental health specialist can help determine which treatment is best for you. Most people with major depressive disorder do well with a combination of medicine and talk therapy. Treatments involving electrical stimulation of the brain can be used in situations with extremely severe symptoms or when medicine and talk therapy do not work over time. These treatments include electroconvulsive therapy, transcranial magnetic stimulation, and vagal nerve stimulation.   This information is not intended to replace advice given to you by your health care provider. Make sure you discuss any questions you have with your health care provider.   Document Released: 07/28/2012 Document Revised: 04/23/2014 Document Reviewed: 07/28/2012 Elsevier Interactive Patient Education 2016 [employer] Generalized Anxiety Disorder Generalized anxiety disorder (GAD) is a mental disorder. It interferes with life functions, including relationships, work, and school. GAD is different from normal anxiety, which everyone experiences at some point in their lives in response to specific life events and activities. Normal anxiety actually helps us prepare for and get through these life events and activities. Normal anxiety goes away after the event or activity is over.  GAD causes anxiety that is not necessarily related to specific events or activities. It also causes excess anxiety in proportion to specific events or activities. The anxiety associated with GAD is also difficult to control. GAD can vary from mild to severe. People with severe GAD can have intense waves of anxiety with physical symptoms (panic attacks).  SYMPTOMS The anxiety and worry associated with GAD  are difficult to control. This anxiety and worry are related to many life events and activities and also occur more days than not for 6 months or longer. People with GAD also have three or more of the following symptoms (one or more in children):  Restlessness.   Fatigue.  Difficulty concentrating.   Irritability.  Muscle tension.  Difficulty sleeping or unsatisfying sleep. DIAGNOSIS GAD is diagnosed through an assessment by your health care provider. Your health care provider will ask you questions aboutyour mood,physical symptoms, and events in your life. Your health care provider may ask you about your medical history and use of alcohol or drugs, including prescription medicines. Your health care provider may also do a physical exam and blood tests. Certain medical conditions and the use of certain substances can cause symptoms similar to those associated with GAD. Your health care provider may refer you to a mental health specialist for further evaluation. TREATMENT The following therapies are usually used to treat GAD:   Medication. Antidepressant medication usually is prescribed for long-term daily control. Antianxiety medicines may be added in severe cases, especially when panic attacks occur.   Talk therapy (psychotherapy). Certain types of talk therapy can be helpful in treating GAD by providing support, education, and guidance. A form of talk therapy called cognitive behavioral therapy can teach you healthy ways to think about and react to daily life events and activities.  Stress managementtechniques. These include yoga, meditation, and exercise and can be very helpful when they are practiced regularly. A mental health specialist can help determine which treatment is best for you. Some people see improvement with one therapy. However, other people require a combination of therapies.   This information is not intended to replace advice given to you by your health care  provider. Make sure you discuss any questions you have with your   health care provider.   Document Released: 07/28/2012 Document Revised: 04/23/2014 Document Reviewed: 07/28/2012 Elsevier Interactive Patient Education 2016 Elsevier Inc.  

## 2015-07-11 ENCOUNTER — Encounter: Payer: Self-pay | Admitting: Family

## 2015-07-11 ENCOUNTER — Ambulatory Visit (INDEPENDENT_AMBULATORY_CARE_PROVIDER_SITE_OTHER): Payer: BLUE CROSS/BLUE SHIELD | Admitting: Family

## 2015-07-11 VITALS — BP 121/86 | HR 96 | Temp 98.1°F | Ht 63.0 in | Wt 159.2 lb

## 2015-07-11 DIAGNOSIS — Z713 Dietary counseling and surveillance: Secondary | ICD-10-CM | POA: Diagnosis not present

## 2015-07-11 DIAGNOSIS — F329 Major depressive disorder, single episode, unspecified: Secondary | ICD-10-CM

## 2015-07-11 DIAGNOSIS — G43109 Migraine with aura, not intractable, without status migrainosus: Secondary | ICD-10-CM | POA: Diagnosis not present

## 2015-07-11 DIAGNOSIS — F411 Generalized anxiety disorder: Secondary | ICD-10-CM

## 2015-07-11 DIAGNOSIS — F32A Depression, unspecified: Secondary | ICD-10-CM

## 2015-07-11 MED ORDER — PHENTERMINE HCL 37.5 MG PO CAPS: 37.5000 mg | ORAL_CAPSULE | ORAL | Status: DC

## 2015-07-11 MED ORDER — TOPIRAMATE 100 MG PO TABS: 100.0000 mg | ORAL_TABLET | Freq: Two times a day (BID) | ORAL | Status: DC

## 2015-07-11 NOTE — Progress Notes (Signed)
   Subjective:    Patient ID: Kristina Strickland, female    DOB: 09-Jun-1985, 30 y.o.   MRN: [id number]   HPI PT presents to the office today to recheck migraine after starting Topmax 50 mg BID. PT reports since starting the Topamax she has had 4 migraines compared to daily headaches. Pt is happy.  PT was also started on Lexapro 10 mg daily for depression and GAD. Pt states this is "working wonders". Pt states her blood pressure is the best it has been in years and she "just feels good".   Pt would like to discuss weight loss and start phentermine. Pt states she has gained 10 lbs.   Review of Systems  Constitutional: Negative.   HENT: Negative.   Eyes: Negative.   Respiratory: Negative.  Negative for shortness of breath.   Cardiovascular: Negative.  Negative for palpitations.  Gastrointestinal: Negative.   Endocrine: Negative.   Genitourinary: Negative.   Musculoskeletal: Negative.   Neurological: Negative.  Negative for headaches.  Hematological: Negative.   Psychiatric/Behavioral: Negative.   All other systems reviewed and are negative.      Objective:   Physical Exam  Constitutional: She is oriented to person, place, and time. She appears well-developed and well-nourished. No distress.  HENT:  Head: Normocephalic and atraumatic.  Eyes: Pupils are equal, round, and reactive to light.  Neck: Normal range of motion. Neck supple. No thyromegaly present.  Cardiovascular: Normal rate, regular rhythm, normal heart sounds and intact distal pulses.   No murmur heard. Pulmonary/Chest: Effort normal and breath sounds normal. No respiratory distress. She has no wheezes.  Abdominal: Soft. Bowel sounds are normal. She exhibits no distension. There is no tenderness.  Musculoskeletal: Normal range of motion. She exhibits no edema or tenderness.  Neurological: She is alert and oriented to person, place, and time.  Skin: Skin is warm and dry.  Psychiatric: She has a normal mood and affect. Her  behavior is normal. Judgment and thought content normal.  Vitals reviewed.   BP 121/86 mmHg  Pulse 96  Temp(Src) 98.1 F (36.7 C) (Oral)  Ht 5\' 3"  (1.6 m)  Wt 159 lb 3.2 oz (72.213 kg)  BMI 28.21 kg/m2  LMP 06/14/2015       Assessment & Plan:  1. Migraine with aura and without status migrainosus, not intractable -PT's topamax increased to 100 mg BID from 50 mg Stress management discussed Avoid caffeine discussed - topiramate (TOPAMAX) 100 MG tablet; Take 1 tablet (100 mg total) by mouth 2 (two) times daily.  Dispense: 180 tablet; Refill: 1  2. Depression Stress management discussed Continue Lexapro 10 mg daily  3. GAD (generalized anxiety disorder) Stress management discussed Continue Lexapro 10 mg daily  4. Weight loss counseling, encounter for Weight loss discussed  Encouraged exercises and diet - phentermine 37.5 MG capsule; Take 1 capsule (37.5 mg total) by mouth every morning.  Dispense: 30 capsule; Refill: 3   Jannifer Rodney, FNP

## 2015-07-11 NOTE — Addendum Note (Signed)
Addended by: Fawn Kirk on: 07/11/2015 04:32 PM   Modules accepted: Kipp Brood

## 2015-07-11 NOTE — Patient Instructions (Signed)

## 2015-07-15 ENCOUNTER — Encounter: Payer: Self-pay | Admitting: Adult Health

## 2015-07-15 ENCOUNTER — Ambulatory Visit (INDEPENDENT_AMBULATORY_CARE_PROVIDER_SITE_OTHER): Payer: BLUE CROSS/BLUE SHIELD | Admitting: Adult Health

## 2015-07-15 ENCOUNTER — Other Ambulatory Visit (HOSPITAL_COMMUNITY)
Admission: RE | Admit: 2015-07-15 | Discharge: 2015-07-15 | Disposition: A | Discharge status: Home / Self Care | Payer: BLUE CROSS/BLUE SHIELD | Source: Ambulatory Visit | Attending: Adult Health | Admitting: Adult Health

## 2015-07-15 VITALS — BP 128/94 | HR 92 | Ht 62.5 in | Wt 157.5 lb

## 2015-07-15 DIAGNOSIS — Z113 Encounter for screening for infections with a predominantly sexual mode of transmission: Secondary | ICD-10-CM | POA: Insufficient documentation

## 2015-07-15 DIAGNOSIS — Z3202 Encounter for pregnancy test, result negative: Secondary | ICD-10-CM

## 2015-07-15 DIAGNOSIS — Z3041 Encounter for surveillance of contraceptive pills: Secondary | ICD-10-CM

## 2015-07-15 DIAGNOSIS — Z01419 Encounter for gynecological examination (general) (routine) without abnormal findings: Secondary | ICD-10-CM | POA: Insufficient documentation

## 2015-07-15 DIAGNOSIS — Z1151 Encounter for screening for human papillomavirus (HPV): Secondary | ICD-10-CM | POA: Insufficient documentation

## 2015-07-15 LAB — POCT URINE PREGNANCY: Preg Test, Ur: NEGATIVE

## 2015-07-15 MED ORDER — NORGESTIM-ETH ESTRAD TRIPHASIC 0.18/0.215/0.25 MG-35 MCG PO TABS: 1.0000 | ORAL_TABLET | Freq: Every day | ORAL | Status: DC
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
## 2015-07-15 NOTE — Patient Instructions (Signed)
Physical in 1 year, pap in 3 if normal Take pills daily Use condoms for 1 month

## 2015-07-20 LAB — CYTOLOGY - PAP

## 2015-08-01 ENCOUNTER — Telehealth: Payer: Self-pay | Admitting: Family

## 2015-08-01 MED ORDER — PHENTERMINE HCL 37.5 MG PO TABS: 37.5000 mg | ORAL_TABLET | Freq: Every day | ORAL | Status: DC

## 2015-08-01 NOTE — Telephone Encounter (Signed)
Left detailed message that rx has been called into pharmacy as requested and to CB with any further questions or concerns. Rx was called into the voicemail at [facility] pharmacy.

## 2015-08-01 NOTE — Telephone Encounter (Signed)
RX ordered.

## 2015-11-21 ENCOUNTER — Ambulatory Visit: Payer: BLUE CROSS/BLUE SHIELD | Admitting: Family

## 2015-11-22 ENCOUNTER — Encounter: Payer: Self-pay | Admitting: Family

## 2015-11-25 ENCOUNTER — Encounter: Payer: Self-pay | Admitting: Family

## 2015-11-25 ENCOUNTER — Ambulatory Visit (INDEPENDENT_AMBULATORY_CARE_PROVIDER_SITE_OTHER): Payer: BLUE CROSS/BLUE SHIELD | Admitting: Family

## 2015-11-25 VITALS — BP 139/97 | HR 108 | Temp 98.4°F | Ht 62.5 in | Wt 158.0 lb

## 2015-11-25 DIAGNOSIS — L255 Unspecified contact dermatitis due to plants, except food: Secondary | ICD-10-CM

## 2015-11-25 MED ORDER — TRIAMCINOLONE ACETONIDE 0.5 % EX OINT: 1.0000 "application " | TOPICAL_OINTMENT | Freq: Two times a day (BID) | CUTANEOUS | 0 refills | Status: DC

## 2015-11-25 MED ORDER — METHYLPREDNISOLONE ACETATE 80 MG/ML IJ SUSP
80.0000 mg | Freq: Once | INTRAMUSCULAR | Status: AC
  Administered 2015-11-25: 80 mg via INTRAMUSCULAR

## 2015-11-25 NOTE — Progress Notes (Signed)
   Subjective:    Patient ID: Kristina Strickland, female    DOB: 09-15-1985, 30 y.o.   MRN: [id number]  Rash  This is a new problem. The current episode started in the past 7 days. The problem has been gradually improving since onset. The affected locations include the back, abdomen, right upper leg, right lower leg, left upper leg and left lower leg. The rash is characterized by redness and itchiness. She was exposed to plant contact. Pertinent negatives include no congestion, cough, diarrhea, shortness of breath or sore throat. Past treatments include anti-itch cream, antibiotic cream and moisturizer. The treatment provided mild relief.      Review of Systems  HENT: Negative for congestion and sore throat.   Respiratory: Negative for cough and shortness of breath.   Gastrointestinal: Negative for diarrhea.  Skin: Positive for rash.  All other systems reviewed and are negative.      Objective:   Physical Exam  Constitutional: She is oriented to person, place, and time. She appears well-developed and well-nourished. No distress.  HENT:  Head: Normocephalic.  Eyes: Pupils are equal, round, and reactive to light.  Neck: Normal range of motion. Neck supple. No thyromegaly present.  Cardiovascular: Normal rate, regular rhythm, normal heart sounds and intact distal pulses.   No murmur heard. Pulmonary/Chest: Effort normal and breath sounds normal. No respiratory distress. She has no wheezes.  Abdominal: Soft. Bowel sounds are normal. She exhibits no distension. There is no tenderness.  Musculoskeletal: Normal range of motion. She exhibits no edema or tenderness.  Neurological: She is alert and oriented to person, place, and time.  Skin: Skin is warm and dry. Rash noted. There is erythema.  Generalized erythemas rash on bilateral thighs and abdomen  Psychiatric: She has a normal mood and affect. Her behavior is normal. Judgment and thought content normal.  Vitals reviewed.    BP (!) 139/97    Pulse (!) 108   Temp 98.4 F (36.9 C) (Oral)   Ht 5' 2.5" (1.588 m)   Wt 158 lb (71.7 kg)   LMP 11/18/2015   BMI 28.44 kg/m       Assessment & Plan:  1. Contact dermatitis due to plant -Do not scratch -Wear protective clothing while outside- Long sleeves and long pants -Put insect repellent on all exposed skin and along clothing -Take a shower as soon as possible after being outside -RTO prn  - triamcinolone ointment (KENALOG) 0.5 %; Apply 1 application topically 2 (two) times daily.  Dispense: 60 g; Refill: 0 - methylPREDNISolone acetate (DEPO-MEDROL) injection 80 mg; Inject 1 mL (80 mg total) into the muscle once.  Jannifer Rodney, FNP

## 2015-11-25 NOTE — Patient Instructions (Signed)

## 2015-11-28 ENCOUNTER — Ambulatory Visit (INDEPENDENT_AMBULATORY_CARE_PROVIDER_SITE_OTHER): Payer: BLUE CROSS/BLUE SHIELD | Admitting: Family

## 2015-11-28 ENCOUNTER — Encounter: Payer: Self-pay | Admitting: Family

## 2015-11-28 VITALS — BP 127/81 | HR 82 | Temp 97.9°F | Ht 62.5 in | Wt 157.6 lb

## 2015-11-28 DIAGNOSIS — G43109 Migraine with aura, not intractable, without status migrainosus: Secondary | ICD-10-CM | POA: Diagnosis not present

## 2015-11-28 DIAGNOSIS — Z713 Dietary counseling and surveillance: Secondary | ICD-10-CM

## 2015-11-28 DIAGNOSIS — F411 Generalized anxiety disorder: Secondary | ICD-10-CM

## 2015-11-28 DIAGNOSIS — K5900 Constipation, unspecified: Secondary | ICD-10-CM | POA: Diagnosis not present

## 2015-11-28 DIAGNOSIS — F329 Major depressive disorder, single episode, unspecified: Secondary | ICD-10-CM

## 2015-11-28 DIAGNOSIS — I10 Essential (primary) hypertension: Secondary | ICD-10-CM | POA: Diagnosis not present

## 2015-11-28 DIAGNOSIS — F32A Depression, unspecified: Secondary | ICD-10-CM

## 2015-11-28 LAB — CMP14+EGFR
ALT: 12 IU/L (ref 0–32)
AST: 15 IU/L (ref 0–40)
Albumin/Globulin Ratio: 1.5 (ref 1.2–2.2)
Albumin: 4.1 g/dL (ref 3.5–5.5)
Alkaline Phosphatase: 45 IU/L (ref 39–117)
BILIRUBIN TOTAL: 0.3 mg/dL (ref 0.0–1.2)
BUN / CREAT RATIO: 16 (ref 9–23)
BUN: 11 mg/dL (ref 6–20)
CO2: 23 mmol/L (ref 18–29)
Calcium: 9.3 mg/dL (ref 8.7–10.2)
Chloride: 100 mmol/L (ref 96–106)
Creatinine, Ser: 0.67 mg/dL (ref 0.57–1.00)
GFR calc Af Amer: 136 mL/min/{1.73_m2} (ref >59)
GFR calc non Af Amer: 118 mL/min/{1.73_m2} (ref >59)
GLUCOSE: 92 mg/dL (ref 65–99)
Globulin, Total: 2.7 g/dL (ref 1.5–4.5)
Potassium: 4 mmol/L (ref 3.5–5.2)
SODIUM: 138 mmol/L (ref 134–144)
TOTAL PROTEIN: 6.8 g/dL (ref 6.0–8.5)

## 2015-11-28 MED ORDER — ESCITALOPRAM OXALATE 10 MG PO TABS: 10.0000 mg | ORAL_TABLET | Freq: Every day | ORAL | 5 refills | Status: DC

## 2015-11-28 MED ORDER — PHENTERMINE HCL 37.5 MG PO TABS: 37.5000 mg | ORAL_TABLET | Freq: Every day | ORAL | 2 refills | Status: DC

## 2015-11-28 NOTE — Progress Notes (Signed)
Subjective:    Patient ID: Kristina Strickland, female    DOB: 1986/02/14, 30 y.o.   MRN: [id number]  Pt presents to the office today for chronic follow up.    Medication Refill  Associated symptoms include headaches and vomiting. Pertinent negatives include no nausea.  Depression       The patient presents with depression.  This is a chronic problem.  The current episode started more than 1 year ago.   The onset quality is gradual.   The problem occurs constantly.  The problem has been waxing and waning since onset.  Associated symptoms include irritable and headaches.  Associated symptoms include no helplessness, no hopelessness, no restlessness, no decreased interest, not sad and no suicidal ideas.     The symptoms are aggravated by nothing.  Past treatments include SSRIs - Selective serotonin reuptake inhibitors.  Compliance with treatment is good.  Previous treatment provided moderate relief.  Past medical history includes anxiety and depression.   Headache   This is a chronic problem. The current episode started more than 1 month ago. The problem occurs intermittently. The problem has been gradually improving. The pain is located in the frontal region. The pain quality is similar to prior headaches. The quality of the pain is described as aching. The pain is at a severity of 9/10. The pain is moderate. Associated symptoms include blurred vision, phonophobia, photophobia and vomiting. Pertinent negatives include no nausea. The symptoms are aggravated by activity and fatigue. She has tried acetaminophen and NSAIDs for the symptoms. The treatment provided mild relief. Her past medical history is significant for hypertension.  Hypertension  This is a chronic problem. The current episode started more than 1 year ago. The problem has been resolved since onset. The problem is controlled. Associated symptoms include anxiety, blurred vision and headaches. Pertinent negatives include no palpitations, peripheral  edema or shortness of breath. Risk factors for coronary artery disease include obesity, sedentary lifestyle and family history. Past treatments include beta blockers. The current treatment provides moderate improvement. There is no history of kidney disease, CAD/MI, CVA or heart failure.  Anxiety  Presents for follow-up visit. Onset was 1 to 5 years ago. The problem has been waxing and waning. Symptoms include depressed mood, excessive worry, irritability, nervous/anxious behavior and panic. Patient reports no nausea, palpitations, restlessness, shortness of breath or suicidal ideas. Symptoms occur most days. The severity of symptoms is moderate. The symptoms are aggravated by family issues. Nighttime awakenings: none.   There are no known risk factors. Her past medical history is significant for anxiety/panic attacks and depression. Past treatments include nothing. The treatment provided no relief.  Constipation  This is a chronic problem. The current episode started more than 1 year ago. The problem has been resolved since onset. Her stool frequency is 4 to 5 times per week. Associated symptoms include vomiting. Pertinent negatives include no bloating or nausea. Treatments tried: Probioitic. The treatment provided moderate relief.  Weight Loss PT currently taking phentermine daily. Pt's weight is unchanged at this time.     Review of Systems  Constitutional: Positive for irritability.  Eyes: Positive for blurred vision and photophobia.  Respiratory: Negative.  Negative for shortness of breath.   Cardiovascular: Negative for palpitations.  Gastrointestinal: Positive for constipation and vomiting. Negative for bloating and nausea.  Endocrine: Negative.   Genitourinary: Negative.   Musculoskeletal: Negative.   Neurological: Positive for headaches.  Hematological: Negative.   Psychiatric/Behavioral: Positive for depression. Negative for suicidal ideas. The patient  is nervous/anxious.   All  other systems reviewed and are negative.      Objective:   Physical Exam  Constitutional: She is oriented to person, place, and time. She appears well-developed and well-nourished. She is irritable. No distress.  HENT:  Head: Normocephalic and atraumatic.  Right Ear: External ear normal.  Left Ear: External ear normal.  Nose: Nose normal.  Mouth/Throat: Oropharynx is clear and moist.  Eyes: Pupils are equal, round, and reactive to light.  Neck: Normal range of motion. Neck supple. No thyromegaly present.  Cardiovascular: Normal rate, regular rhythm, normal heart sounds and intact distal pulses.   No murmur heard. Pulmonary/Chest: Effort normal and breath sounds normal. No respiratory distress. She has no wheezes.  Abdominal: Soft. Bowel sounds are normal. She exhibits no distension. There is no tenderness.  Musculoskeletal: Normal range of motion. She exhibits no edema or tenderness.  Neurological: She is alert and oriented to person, place, and time. She has normal reflexes. No cranial nerve deficit.  Skin: Skin is warm and dry.  Psychiatric: She has a normal mood and affect. Her behavior is normal. Judgment and thought content normal.  Vitals reviewed.     BP 127/81   Pulse 82   Temp 97.9 F (36.6 C) (Oral)   Ht 5' 2.5" (1.588 m)   Wt 157 lb 9.6 oz (71.5 kg)   LMP 11/18/2015   BMI 28.37 kg/m      Assessment & Plan:  1. Essential hypertension - CMP14+EGFR  2. Migraine with aura and without status migrainosus, not intractable - CMP14+EGFR  3. Constipation, unspecified constipation type - CMP14+EGFR  4. Depression - CMP14+EGFR - escitalopram (LEXAPRO) 10 MG tablet; Take 1 tablet (10 mg total) by mouth daily.  Dispense: 30 tablet; Refill: 5  5. GAD (generalized anxiety disorder) - CMP14+EGFR - escitalopram (LEXAPRO) 10 MG tablet; Take 1 tablet (10 mg total) by mouth daily.  Dispense: 30 tablet; Refill: 5  6. Weight loss counseling, encounter for -Discussed pt  needs to lose 5% of weight loss before it can be refilled. Pt states she has quit her second job and is going to start exercising and stop eating after 4 pm.  - phentermine (ADIPEX-P) 37.5 MG tablet; Take 1 tablet (37.5 mg total) by mouth daily before breakfast.  Dispense: 30 tablet; Refill: 2   Continue all meds Labs pending Health Maintenance reviewed Diet and exercise encouraged RTO 3 months   Evelina Dun, FNP

## 2015-11-28 NOTE — Patient Instructions (Signed)
Health Maintenance, Female Adopting a healthy lifestyle and getting preventive care can go a long way to promote health and wellness. Talk with your health care provider about what schedule of regular examinations is right for you. This is a good chance for you to check in with your provider about disease prevention and staying healthy. In between checkups, there are plenty of things you can do on your own. Experts have done a lot of research about which lifestyle changes and preventive measures are most likely to keep you healthy. Ask your health care provider for more information. WEIGHT AND DIET  Eat a healthy diet  Be sure to include plenty of vegetables, fruits, low-fat dairy products, and lean protein.  Do not eat a lot of foods high in solid fats, added sugars, or salt.  Get regular exercise. This is one of the most important things you can do for your health.  Most adults should exercise for at least 150 minutes each week. The exercise should increase your heart rate and make you sweat (moderate-intensity exercise).  Most adults should also do strengthening exercises at least twice a week. This is in addition to the moderate-intensity exercise.  Maintain a healthy weight  Body mass index (BMI) is a measurement that can be used to identify possible weight problems. It estimates body fat based on height and weight. Your health care provider can help determine your BMI and help you achieve or maintain a healthy weight.  For females 30 years of age and older:   A BMI below 18.5 is considered underweight.  A BMI of 18.5 to 24.9 is normal.  A BMI of 25 to 29.9 is considered overweight.  A BMI of 30 and above is considered obese.  Watch levels of cholesterol and blood lipids  You should start having your blood tested for lipids and cholesterol at 30 years of age, then have this test every 5 years.  You may need to have your cholesterol levels checked more often if:  Your lipid  or cholesterol levels are high.  You are older than 30 years of age.  You are at high risk for heart disease.  CANCER SCREENING   Lung Cancer  Lung cancer screening is recommended for adults 55-80 years old who are at high risk for lung cancer because of a history of smoking.  A yearly low-dose CT scan of the lungs is recommended for people who:  Currently smoke.  Have quit within the past 15 years.  Have at least a 30-pack-year history of smoking. A pack year is smoking an average of one pack of cigarettes a day for 1 year.  Yearly screening should continue until it has been 15 years since you quit.  Yearly screening should stop if you develop a health problem that would prevent you from having lung cancer treatment.  Breast Cancer  Practice breast self-awareness. This means understanding how your breasts normally appear and feel.  It also means doing regular breast self-exams. Let your health care provider know about any changes, no matter how small.  If you are in your 20s or 30s, you should have a clinical breast exam (CBE) by a health care provider every 1-3 years as part of a regular health exam.  If you are 40 or older, have a CBE every year. Also consider having a breast X-ray (mammogram) every year.  If you have a family history of breast cancer, talk to your health care provider about genetic screening.  If you   are at high risk for breast cancer, talk to your health care provider about having an MRI and a mammogram every year.  Breast cancer gene (BRCA) assessment is recommended for women who have family members with BRCA-related cancers. BRCA-related cancers include:  Breast.  Ovarian.  Tubal.  Peritoneal cancers.  Results of the assessment will determine the need for genetic counseling and BRCA1 and BRCA2 testing. Cervical Cancer Your health care provider may recommend that you be screened regularly for cancer of the pelvic organs (ovaries, uterus, and  vagina). This screening involves a pelvic examination, including checking for microscopic changes to the surface of your cervix (Pap test). You may be encouraged to have this screening done every 3 years, beginning at age 21.  For women ages 30-65, health care providers may recommend pelvic exams and Pap testing every 3 years, or they may recommend the Pap and pelvic exam, combined with testing for human papilloma virus (HPV), every 5 years. Some types of HPV increase your risk of cervical cancer. Testing for HPV may also be done on women of any age with unclear Pap test results.  Other health care providers may not recommend any screening for nonpregnant women who are considered low risk for pelvic cancer and who do not have symptoms. Ask your health care provider if a screening pelvic exam is right for you.  If you have had past treatment for cervical cancer or a condition that could lead to cancer, you need Pap tests and screening for cancer for at least 20 years after your treatment. If Pap tests have been discontinued, your risk factors (such as having a new sexual partner) need to be reassessed to determine if screening should resume. Some women have medical problems that increase the chance of getting cervical cancer. In these cases, your health care provider may recommend more frequent screening and Pap tests. Colorectal Cancer  This type of cancer can be detected and often prevented.  Routine colorectal cancer screening usually begins at 30 years of age and continues through 30 years of age.  Your health care provider may recommend screening at an earlier age if you have risk factors for colon cancer.  Your health care provider may also recommend using home test kits to check for hidden blood in the stool.  A small camera at the end of a tube can be used to examine your colon directly (sigmoidoscopy or colonoscopy). This is done to check for the earliest forms of colorectal  cancer.  Routine screening usually begins at age 30.  Direct examination of the colon should be repeated every 5-10 years through 30 years of age. However, you may need to be screened more often if early forms of precancerous polyps or small growths are found. Skin Cancer  Check your skin from head to toe regularly.  Tell your health care provider about any new moles or changes in moles, especially if there is a change in a mole's shape or color.  Also tell your health care provider if you have a mole that is larger than the size of a pencil eraser.  Always use sunscreen. Apply sunscreen liberally and repeatedly throughout the day.  Protect yourself by wearing long sleeves, pants, a wide-brimmed hat, and sunglasses whenever you are outside. HEART DISEASE, DIABETES, AND HIGH BLOOD PRESSURE   High blood pressure causes heart disease and increases the risk of stroke. High blood pressure is more likely to develop in:  People who have blood pressure in the high end   of the normal range (130-139/85-89 mm Hg).  People who are overweight or obese.  People who are African American.  If you are 38-23 years of age, have your blood pressure checked every 3-5 years. If you are 61 years of age or older, have your blood pressure checked every year. You should have your blood pressure measured twice--once when you are at a hospital or clinic, and once when you are not at a hospital or clinic. Record the average of the two measurements. To check your blood pressure when you are not at a hospital or clinic, you can use:  An automated blood pressure machine at a pharmacy.  A home blood pressure monitor.  If you are between 45 years and 39 years old, ask your health care provider if you should take aspirin to prevent strokes.  Have regular diabetes screenings. This involves taking a blood sample to check your fasting blood sugar level.  If you are at a normal weight and have a low risk for diabetes,  have this test once every three years after 30 years of age.  If you are overweight and have a high risk for diabetes, consider being tested at a younger age or more often. PREVENTING INFECTION  Hepatitis B  If you have a higher risk for hepatitis B, you should be screened for this virus. You are considered at high risk for hepatitis B if:  You were born in a country where hepatitis B is common. Ask your health care provider which countries are considered high risk.  Your parents were born in a high-risk country, and you have not been immunized against hepatitis B (hepatitis B vaccine).  You have HIV or AIDS.  You use needles to inject street drugs.  You live with someone who has hepatitis B.  You have had sex with someone who has hepatitis B.  You get hemodialysis treatment.  You take certain medicines for conditions, including cancer, organ transplantation, and autoimmune conditions. Hepatitis C  Blood testing is recommended for:  Everyone born from [birth year] through 1965.  Anyone with known risk factors for hepatitis C. Sexually transmitted infections (STIs)  You should be screened for sexually transmitted infections (STIs) including gonorrhea and chlamydia if:  You are sexually active and are younger than 30 years of age.  You are older than 30 years of age and your health care provider tells you that you are at risk for this type of infection.  Your sexual activity has changed since you were last screened and you are at an increased risk for chlamydia or gonorrhea. Ask your health care provider if you are at risk.  If you do not have HIV, but are at risk, it may be recommended that you take a prescription medicine daily to prevent HIV infection. This is called pre-exposure prophylaxis (PrEP). You are considered at risk if:  You are sexually active and do not regularly use condoms or know the HIV status of your partner(s).  You take drugs by injection.  You are sexually  active with a partner who has HIV. Talk with your health care provider about whether you are at high risk of being infected with HIV. If you choose to begin PrEP, you should first be tested for HIV. You should then be tested every 3 months for as long as you are taking PrEP.  PREGNANCY   If you are premenopausal and you may become pregnant, ask your health care provider about preconception counseling.  If you may  become pregnant, take 400 to 800 micrograms (mcg) of folic acid every day.  If you want to prevent pregnancy, talk to your health care provider about birth control (contraception). OSTEOPOROSIS AND MENOPAUSE   Osteoporosis is a disease in which the bones lose minerals and strength with aging. This can result in serious bone fractures. Your risk for osteoporosis can be identified using a bone density scan.  If you are 61 years of age or older, or if you are at risk for osteoporosis and fractures, ask your health care provider if you should be screened.  Ask your health care provider whether you should take a calcium or vitamin D supplement to lower your risk for osteoporosis.  Menopause may have certain physical symptoms and risks.  Hormone replacement therapy may reduce some of these symptoms and risks. Talk to your health care provider about whether hormone replacement therapy is right for you.  HOME CARE INSTRUCTIONS   Schedule regular health, dental, and eye exams.  Stay current with your immunizations.   Do not use any tobacco products including cigarettes, chewing tobacco, or electronic cigarettes.  If you are pregnant, do not drink alcohol.  If you are breastfeeding, limit how much and how often you drink alcohol.  Limit alcohol intake to no more than 1 drink per day for nonpregnant women. One drink equals 12 ounces of beer, 5 ounces of wine, or 1 ounces of hard liquor.  Do not use street drugs.  Do not share needles.  Ask your health care provider for help if  you need support or information about quitting drugs.  Tell your health care provider if you often feel depressed.  Tell your health care provider if you have ever been abused or do not feel safe at home.   This information is not intended to replace advice given to you by your health care provider. Make sure you discuss any questions you have with your health care provider.   Document Released: 10/16/2010 Document Revised: 04/23/2014 Document Reviewed: 03/04/2013 Elsevier Interactive Patient Education [employer].

## 2015-12-07 ENCOUNTER — Telehealth: Payer: Self-pay | Admitting: Family

## 2015-12-07 NOTE — Telephone Encounter (Signed)
Patient aware that are rx for phentermine was for tablets not capsules. Advised patient to call the pharmacy.

## 2015-12-08 ENCOUNTER — Ambulatory Visit (INDEPENDENT_AMBULATORY_CARE_PROVIDER_SITE_OTHER): Payer: BLUE CROSS/BLUE SHIELD | Admitting: Family

## 2015-12-08 ENCOUNTER — Encounter: Payer: Self-pay | Admitting: Family

## 2015-12-08 VITALS — BP 134/89 | HR 109 | Temp 97.4°F | Ht 62.5 in | Wt 157.8 lb

## 2015-12-08 DIAGNOSIS — L255 Unspecified contact dermatitis due to plants, except food: Secondary | ICD-10-CM | POA: Diagnosis not present

## 2015-12-08 DIAGNOSIS — S80921A Unspecified superficial injury of right lower leg, initial encounter: Secondary | ICD-10-CM

## 2015-12-08 DIAGNOSIS — Z713 Dietary counseling and surveillance: Secondary | ICD-10-CM | POA: Diagnosis not present

## 2015-12-08 DIAGNOSIS — W57XXXA Bitten or stung by nonvenomous insect and other nonvenomous arthropods, initial encounter: Secondary | ICD-10-CM

## 2015-12-08 MED ORDER — PHENTERMINE HCL 37.5 MG PO TABS: 37.5000 mg | ORAL_TABLET | Freq: Every day | ORAL | 2 refills | Status: DC

## 2015-12-08 MED ORDER — PREDNISONE 10 MG (21) PO TBPK: ORAL_TABLET | ORAL | 0 refills | Status: DC

## 2015-12-08 MED ORDER — DOXYCYCLINE HYCLATE 100 MG PO TABS: 100.0000 mg | ORAL_TABLET | Freq: Two times a day (BID) | ORAL | 0 refills | Status: DC

## 2015-12-08 NOTE — Patient Instructions (Signed)

## 2015-12-08 NOTE — Progress Notes (Signed)
   Subjective:    Patient ID: Kristina Strickland, female    DOB: 08-25-1985, 30 y.o.   MRN: [id number]  PT presents to the office today for recurrent rash related to poison oak. Pt is also complaining of a boil on right calf. Pt states she noticed it last week and is erythemas. Pt states is is not draining. PT would like rx of phentermine printed today, because she lost her last rx.  Rash  This is a recurrent problem. The current episode started 1 to 4 weeks ago. The problem has been waxing and waning since onset. The affected locations include the left lower leg, left upper leg and left arm. The rash is characterized by itchiness and redness. She was exposed to plant contact. Pertinent negatives include no cough, fever or shortness of breath. Past treatments include anti-itch cream and oral steroids. The treatment provided mild relief.      Review of Systems  Constitutional: Negative for fever.  Respiratory: Negative for cough and shortness of breath.   Skin: Positive for rash and wound.  All other systems reviewed and are negative.      Objective:   Physical Exam  Constitutional: She is oriented to person, place, and time. She appears well-developed and well-nourished. No distress.  HENT:  Head: Normocephalic and atraumatic.  Eyes: Pupils are equal, round, and reactive to light.  Neck: Normal range of motion. Neck supple. No thyromegaly present.  Cardiovascular: Normal rate, regular rhythm, normal heart sounds and intact distal pulses.   No murmur heard. Pulmonary/Chest: Effort normal and breath sounds normal. No respiratory distress. She has no wheezes.  Abdominal: Soft. Bowel sounds are normal. She exhibits no distension. There is no tenderness.  Musculoskeletal: Normal range of motion. She exhibits no edema or tenderness.  Neurological: She is alert and oriented to person, place, and time.  Skin: Skin is warm and dry. Rash noted.  1cmX1cm circular lesion on back right calf. Generalized  erythemas on left thigh and right arm   Psychiatric: She has a normal mood and affect. Her behavior is normal. Judgment and thought content normal.  Vitals reviewed.    BP 134/89   Pulse (!) 109   Temp 97.4 F (36.3 C) (Oral)   Ht 5' 2.5" (1.588 m)   Wt 157 lb 12.8 oz (71.6 kg)   LMP 11/18/2015   BMI 28.40 kg/m       Assessment & Plan:  1. Insect bite -Warm compresses  -Do not pick or squeeze -RTO on Monday  - doxycycline (VIBRA-TABS) 100 MG tablet; Take 1 tablet (100 mg total) by mouth 2 (two) times daily.  Dispense: 20 tablet; Refill: 0  2. Contact dermatitis due to plant _Good hand hygiene discussed -Wash clothing and bed linens - predniSONE (STERAPRED UNI-PAK 21 TAB) 10 MG (21) TBPK tablet; Use as directed  Dispense: 21 tablet; Refill  3. Weight loss counseling, encounter for -Lost rx last visit - phentermine (ADIPEX-P) 37.5 MG tablet; Take 1 tablet (37.5 mg total) by mouth daily before breakfast.  Dispense: 30 tablet; Refill: 2  Jannifer Rodney, FNP

## 2015-12-12 ENCOUNTER — Ambulatory Visit: Payer: BLUE CROSS/BLUE SHIELD | Admitting: Family

## 2015-12-13 ENCOUNTER — Encounter: Payer: Self-pay | Admitting: Family

## 2015-12-15 ENCOUNTER — Other Ambulatory Visit: Payer: Self-pay | Admitting: *Deleted

## 2016-03-29 ENCOUNTER — Ambulatory Visit (INDEPENDENT_AMBULATORY_CARE_PROVIDER_SITE_OTHER): Payer: BLUE CROSS/BLUE SHIELD | Admitting: Nurse Practitioner

## 2016-03-29 ENCOUNTER — Encounter: Payer: Self-pay | Admitting: Nurse Practitioner

## 2016-03-29 VITALS — BP 150/94 | HR 88 | Temp 97.0°F | Ht 62.5 in | Wt 169.0 lb

## 2016-03-29 DIAGNOSIS — I10 Essential (primary) hypertension: Secondary | ICD-10-CM

## 2016-03-29 DIAGNOSIS — J069 Acute upper respiratory infection, unspecified: Secondary | ICD-10-CM

## 2016-03-29 DIAGNOSIS — J029 Acute pharyngitis, unspecified: Secondary | ICD-10-CM

## 2016-03-29 LAB — CULTURE, GROUP A STREP

## 2016-03-29 LAB — RAPID STREP SCREEN (MED CTR MEBANE ONLY): Strep Gp A Ag, IA W/Reflex: NEGATIVE

## 2016-03-29 MED ORDER — OLMESARTAN MEDOXOMIL 20 MG PO TABS: 20.0000 mg | ORAL_TABLET | Freq: Every day | ORAL | 5 refills | Status: DC

## 2016-03-29 MED ORDER — AZITHROMYCIN 250 MG PO TABS: ORAL_TABLET | ORAL | 0 refills | Status: DC

## 2016-03-29 NOTE — Patient Instructions (Signed)

## 2016-03-29 NOTE — Progress Notes (Signed)
   Subjective:    Patient ID: Kristina Strickland, female    DOB: 01-05-1986, 30 y.o.   MRN: [id number]  HPI Patient in today c/o sore throat, headache and congestion. Foul taste in her mouth.  * History of hypertension- patient was on meds- lost weight and was able to stop medication- has gained weight back and now blood pressure is up again.  Review of Systems  Constitutional: Negative for appetite change.  HENT: Positive for congestion, ear pain, sore throat and trouble swallowing. Negative for voice change.   Respiratory: Positive for cough (dry).   Cardiovascular: Negative.   Gastrointestinal: Negative.   Genitourinary: Negative.   Neurological: Negative.   Psychiatric/Behavioral: Negative.        Objective:   Physical Exam  Constitutional: She is oriented to person, place, and time. She appears well-developed and well-nourished. No distress.  HENT:  Right Ear: Hearing, tympanic membrane, external ear and ear canal normal.  Left Ear: Hearing, tympanic membrane, external ear and ear canal normal.  Nose: Mucosal edema and rhinorrhea present. Right sinus exhibits no maxillary sinus tenderness and no frontal sinus tenderness. Left sinus exhibits no maxillary sinus tenderness and no frontal sinus tenderness.  Mouth/Throat: Uvula is midline, oropharynx is clear and moist and mucous membranes are normal.  Neck: Normal range of motion. Neck supple.  Cardiovascular: Normal rate, regular rhythm and normal heart sounds.   Pulmonary/Chest: Effort normal and breath sounds normal.  Lymphadenopathy:    She has no cervical adenopathy.  Neurological: She is alert and oriented to person, place, and time.  Skin: Skin is warm and dry.  Psychiatric: She has a normal mood and affect. Her behavior is normal. Judgment and thought content normal.   BP (!) 150/94 (BP Location: Left Arm, Cuff Size: Large)   Pulse 88   Temp 97 F (36.1 C) (Oral)   Ht 5' 2.5" (1.588 m)   Wt 169 lb (76.7 kg)   BMI 30.42  kg/m    Strep negative     Assessment & Plan:  1. Sore throat - Rapid strep screen (not at [facility])  2. Essential hypertension Low sodium diet - olmesartan (BENICAR) 20 MG tablet; Take 1 tablet (20 mg total) by mouth daily.  Dispense: 30 tablet; Refill: 5 - CMP  3. Acute upper respiratory infection 1. Take meds as prescribed 2. Use a cool mist humidifier especially during the winter months and when heat has been humid. 3. Use saline nose sprays frequently 4. Saline irrigations of the nose can be very helpful if done frequently.  * 4X daily for 1 week*  * Use of a nettie pot can be helpful with this. Follow directions with this* 5. Drink plenty of fluids 6. Keep thermostat turn down low 7.For any cough or congestion  Use plain Mucinex- regular strength or max strength is fine   * Children- consult with Pharmacist for dosing 8. For fever or aces or pains- take tylenol or ibuprofen appropriate for age and weight.  * for fevers greater than 101 orally you may alternate ibuprofen and tylenol every  3 hours.   - azithromycin (ZITHROMAX Z-PAK) 250 MG tablet; As directed  Dispense: 6 tablet; Refill: 0   Mary-Margaret Daphine Deutscher, FNP

## 2016-05-22 ENCOUNTER — Telehealth: Payer: Self-pay | Admitting: Adult Health

## 2016-05-22 MED ORDER — NORGESTIM-ETH ESTRAD TRIPHASIC 0.18/0.215/0.25 MG-35 MCG PO TABS: 1.0000 | ORAL_TABLET | Freq: Every day | ORAL | 3 refills | Status: DC

## 2016-05-22 NOTE — Telephone Encounter (Signed)
Will refill OCs 

## 2016-05-22 NOTE — Telephone Encounter (Signed)
Spoke with pt. Pt needs 2 refills on birth control. She is going to have physical in April but will run out before then. Thanks!! JSY

## 2016-08-08 ENCOUNTER — Encounter: Payer: Self-pay | Admitting: Adult Health

## 2016-08-08 ENCOUNTER — Ambulatory Visit (INDEPENDENT_AMBULATORY_CARE_PROVIDER_SITE_OTHER): Payer: Self-pay | Admitting: Adult Health

## 2016-08-08 VITALS — BP 150/100 | HR 89 | Ht 62.25 in | Wt 163.0 lb

## 2016-08-08 DIAGNOSIS — I10 Essential (primary) hypertension: Secondary | ICD-10-CM

## 2016-08-08 DIAGNOSIS — Z3202 Encounter for pregnancy test, result negative: Secondary | ICD-10-CM

## 2016-08-08 DIAGNOSIS — Z3041 Encounter for surveillance of contraceptive pills: Secondary | ICD-10-CM

## 2016-08-08 DIAGNOSIS — Z01419 Encounter for gynecological examination (general) (routine) without abnormal findings: Secondary | ICD-10-CM

## 2016-08-08 DIAGNOSIS — G43109 Migraine with aura, not intractable, without status migrainosus: Secondary | ICD-10-CM

## 2016-08-08 LAB — POCT URINE PREGNANCY: Preg Test, Ur: NEGATIVE

## 2016-08-08 MED ORDER — RIZATRIPTAN BENZOATE 10 MG PO TABS: 10.0000 mg | ORAL_TABLET | ORAL | 0 refills | Status: DC | PRN

## 2016-08-08 MED ORDER — NORETHINDRONE 0.35 MG PO TABS: 1.0000 | ORAL_TABLET | Freq: Every day | ORAL | 11 refills | Status: DC

## 2016-08-08 NOTE — Progress Notes (Signed)
Patient ID: Kristina Strickland, female   DOB: 11/13/1985, 31 y.o.   MRN: [id number] History of Present Illness: Kristina Strickland is a 31 year old white female, married in for a well woman gyn exam,had normal pap with negative HPV 07/15/15.She currently has no insurance.Headaches are getting worse.  PCP is Robyne Askew, NP at [facility].   Current Medications, Allergies, Past Medical History, Past Surgical History, Family History and Social History were reviewed in [facility] record.     Review of Systems: Patient denies anyhearing loss, fatigue, blurred vision, shortness of breath, chest pain, abdominal pain, problems with bowel movements, urination, or intercourse. No joint pain or mood swings. +headaches, are getting worse and has vision loss and feels nauseated and feels foggy, with memory changes, is on Topamax but not helping and has not gotten BP meds yet from PCP.  Physical Exam:BP (!) 150/100 (BP Location: Left Arm, Patient Position: Sitting, Cuff Size: Normal)   Pulse 89   Ht 5' 2.25" (1.581 m)   Wt 163 lb (73.9 kg)   LMP 07/05/2016 (Approximate)   BMI 29.57 kg/m UPT negative.  General:  Well developed, well nourished, no acute distress Skin:  Warm and dry Neck:  Midline trachea, normal thyroid, good ROM, no lymphadenopathy Lungs; Clear to auscultation bilaterally Breast:  No dominant palpable mass, retraction, or nipple discharge Cardiovascular: Regular rate and rhythm Abdomen:  Soft, non tender, no hepatosplenomegaly Pelvic:  External genitalia is normal in appearance, no lesions.  The vagina is normal in appearance. Urethra has no lesions or masses. The cervix is bulbous.  Uterus is felt to be normal size, shape, and contour.  No adnexal masses or tenderness noted.Bladder is non tender, no masses felt. Extremities/musculoskeletal:  No swelling or varicosities noted, no clubbing or cyanosis Psych:  No mood changes, alert and cooperative,seems happy PHQ 2 score  0.Discussed would change birth control pills to POP and give maxalt to try but needs to see neurologists, she says she will  get BP meds and will call PCP.  Impression: 1. Well woman exam with routine gynecological exam   2. Pregnancy examination or test, negative result   3. Encounter for surveillance of contraceptive pills   4. Essential hypertension   5. Migraine with aura and without status migrainosus, not intractable       Plan: Stop tri sprintec, and start micronor, use condoms x 1 pack and condoms given Meds ordered this encounter  Medications  . norethindrone (MICRONOR,CAMILA,ERRIN) 0.35 MG tablet    Sig: Take 1 tablet (0.35 mg total) by mouth daily.    Dispense:  1 Package    Refill:  11    Order Specific Question:   Supervising Provider    Answer:   Despina Hidden, LUTHER H [[id number]]  . rizatriptan (MAXALT) 10 MG tablet    Sig: Take 1 tablet (10 mg total) by mouth as needed for migraine. May repeat in 2 hours if needed    Dispense:  10 tablet    Refill:  0    Order Specific Question:   Supervising Provider    Answer:   Duane Lope H [[id number]]   Follow up in 3 months Physical in 1 year Pap in 2020 Call C. Lendon Colonel, NP and talk with her about neurology referral for headaches and BP check

## 2016-09-06 ENCOUNTER — Other Ambulatory Visit: Payer: Self-pay | Admitting: Family

## 2016-11-12 ENCOUNTER — Ambulatory Visit: Payer: Self-pay | Admitting: Adult Health

## 2018-01-14 ENCOUNTER — Ambulatory Visit: Payer: Self-pay | Admitting: Adult Health

## 2018-04-01 ENCOUNTER — Encounter: Payer: Self-pay | Admitting: Adult Health

## 2018-04-01 ENCOUNTER — Ambulatory Visit (INDEPENDENT_AMBULATORY_CARE_PROVIDER_SITE_OTHER): Payer: Medicaid Other | Admitting: Adult Health

## 2018-04-01 VITALS — BP 156/97 | HR 116 | Ht 63.0 in | Wt 181.0 lb

## 2018-04-01 DIAGNOSIS — O0930 Supervision of pregnancy with insufficient antenatal care, unspecified trimester: Secondary | ICD-10-CM | POA: Insufficient documentation

## 2018-04-01 DIAGNOSIS — Z3201 Encounter for pregnancy test, result positive: Secondary | ICD-10-CM

## 2018-04-01 DIAGNOSIS — N926 Irregular menstruation, unspecified: Secondary | ICD-10-CM

## 2018-04-01 DIAGNOSIS — O0932 Supervision of pregnancy with insufficient antenatal care, second trimester: Secondary | ICD-10-CM

## 2018-04-01 DIAGNOSIS — Z3A26 26 weeks gestation of pregnancy: Secondary | ICD-10-CM | POA: Insufficient documentation

## 2018-04-01 DIAGNOSIS — O162 Unspecified maternal hypertension, second trimester: Secondary | ICD-10-CM

## 2018-04-01 DIAGNOSIS — Z363 Encounter for antenatal screening for malformations: Secondary | ICD-10-CM

## 2018-04-01 DIAGNOSIS — Z98891 History of uterine scar from previous surgery: Secondary | ICD-10-CM

## 2018-04-01 LAB — POCT URINE PREGNANCY: Preg Test, Ur: POSITIVE — AB

## 2018-04-01 MED ORDER — PRENATAL PLUS 27-1 MG PO TABS: 1.0000 | ORAL_TABLET | Freq: Every day | ORAL | 12 refills | Status: DC

## 2018-04-01 MED ORDER — LABETALOL HCL 200 MG PO TABS: 200.0000 mg | ORAL_TABLET | Freq: Two times a day (BID) | ORAL | 6 refills | Status: DC

## 2018-04-01 NOTE — Patient Instructions (Signed)
Third Trimester of Pregnancy The third trimester is from week 29 through week 42, months 7 through 9. This trimester is when your unborn baby (fetus) is growing very fast. At the end of the ninth month, the unborn baby is about 20 inches in length. It weighs about 6-10 pounds. Follow these instructions at home:  Avoid all smoking, herbs, and alcohol. Avoid drugs not approved by your doctor.  Do not use any tobacco products, including cigarettes, chewing tobacco, and electronic cigarettes. If you need help quitting, ask your doctor. You may get counseling or other support to help you quit.  Only take medicine as told by your doctor. Some medicines are safe and some are not during pregnancy.  Exercise only as told by your doctor. Stop exercising if you start having cramps.  Eat regular, healthy meals.  Wear a good support bra if your breasts are tender.  Do not use hot tubs, steam rooms, or saunas.  Wear your seat belt when driving.  Avoid raw meat, uncooked cheese, and liter boxes and soil used by cats.  Take your prenatal vitamins.  Take 1500-2000 milligrams of calcium daily starting at the 20th week of pregnancy until you deliver your baby.  Try taking medicine that helps you poop (stool softener) as needed, and if your doctor approves. Eat more fiber by eating fresh fruit, vegetables, and whole grains. Drink enough fluids to keep your pee (urine) clear or pale yellow.  Take warm water baths (sitz baths) to soothe pain or discomfort caused by hemorrhoids. Use hemorrhoid cream if your doctor approves.  If you have puffy, bulging veins (varicose veins), wear support hose. Raise (elevate) your feet for 15 minutes, 3-4 times a day. Limit salt in your diet.  Avoid heavy lifting, wear low heels, and sit up straight.  Rest with your legs raised if you have leg cramps or low back pain.  Visit your dentist if you have not gone during your pregnancy. Use a soft toothbrush to brush your  teeth. Be gentle when you floss.  You can have sex (intercourse) unless your doctor tells you not to.  Do not travel far distances unless you must. Only do so with your doctor's approval.  Take prenatal classes.  Practice driving to the hospital.  Pack your hospital bag.  Prepare the baby's room.  Go to your doctor visits. Get help if:  You are not sure if you are in labor or if your water has broken.  You are dizzy.  You have mild cramps or pressure in your lower belly (abdominal).  You have a nagging pain in your belly area.  You continue to feel sick to your stomach (nauseous), throw up (vomit), or have watery poop (diarrhea).  You have bad smelling fluid coming from your vagina.  You have pain with peeing (urination). Get help right away if:  You have a fever.  You are leaking fluid from your vagina.  You are spotting or bleeding from your vagina.  You have severe belly cramping or pain.  You lose or gain weight rapidly.  You have trouble catching your breath and have chest pain.  You notice sudden or extreme puffiness (swelling) of your face, hands, ankles, feet, or legs.  You have not felt the baby move in over an hour.  You have severe headaches that do not go away with medicine.  You have vision changes. This information is not intended to replace advice given to you by your health care provider. Make   sure you discuss any questions you have with your health care provider. Document Released: 06/27/2009 Document Revised: 09/08/2015 Document Reviewed: 06/03/2012 Elsevier Interactive Patient Education  2017 [employer] Second Trimester of Pregnancy The second trimester is from week 13 through week 28, month 4 through 6. This is often the time in pregnancy that you feel your best. Often times, morning sickness has lessened or quit. You may have more energy, and you may get hungry more often. Your unborn baby (fetus) is growing rapidly. At the end of the  sixth month, he or she is about 9 inches long and weighs about 1 pounds. You will likely feel the baby move (quickening) between 18 and 20 weeks of pregnancy. Follow these instructions at home:  Avoid all smoking, herbs, and alcohol. Avoid drugs not approved by your doctor.  Do not use any tobacco products, including cigarettes, chewing tobacco, and electronic cigarettes. If you need help quitting, ask your doctor. You may get counseling or other support to help you quit.  Only take medicine as told by your doctor. Some medicines are safe and some are not during pregnancy.  Exercise only as told by your doctor. Stop exercising if you start having cramps.  Eat regular, healthy meals.  Wear a good support bra if your breasts are tender.  Do not use hot tubs, steam rooms, or saunas.  Wear your seat belt when driving.  Avoid raw meat, uncooked cheese, and liter boxes and soil used by cats.  Take your prenatal vitamins.  Take 1500-2000 milligrams of calcium daily starting at the 20th week of pregnancy until you deliver your baby.  Try taking medicine that helps you poop (stool softener) as needed, and if your doctor approves. Eat more fiber by eating fresh fruit, vegetables, and whole grains. Drink enough fluids to keep your pee (urine) clear or pale yellow.  Take warm water baths (sitz baths) to soothe pain or discomfort caused by hemorrhoids. Use hemorrhoid cream if your doctor approves.  If you have puffy, bulging veins (varicose veins), wear support hose. Raise (elevate) your feet for 15 minutes, 3-4 times a day. Limit salt in your diet.  Avoid heavy lifting, wear low heals, and sit up straight.  Rest with your legs raised if you have leg cramps or low back pain.  Visit your dentist if you have not gone during your pregnancy. Use a soft toothbrush to brush your teeth. Be gentle when you floss.  You can have sex (intercourse) unless your doctor tells you not to.  Go to your  doctor visits. Get help if:  You feel dizzy.  You have mild cramps or pressure in your lower belly (abdomen).  You have a nagging pain in your belly area.  You continue to feel sick to your stomach (nauseous), throw up (vomit), or have watery poop (diarrhea).  You have bad smelling fluid coming from your vagina.  You have pain with peeing (urination). Get help right away if:  You have a fever.  You are leaking fluid from your vagina.  You have spotting or bleeding from your vagina.  You have severe belly cramping or pain.  You lose or gain weight rapidly.  You have trouble catching your breath and have chest pain.  You notice sudden or extreme puffiness (swelling) of your face, hands, ankles, feet, or legs.  You have not felt the baby move in over an hour.  You have severe headaches that do not go away with medicine.  You have   vision changes. This information is not intended to replace advice given to you by your health care provider. Make sure you discuss any questions you have with your health care provider. Document Released: 06/27/2009 Document Revised: 09/08/2015 Document Reviewed: 06/03/2012 Elsevier Interactive Patient Education  2017 Elsevier Inc.  

## 2018-04-01 NOTE — Progress Notes (Signed)
Patient ID: Kristina Strickland, female   DOB: 1985/08/10, 32 y.o.   MRN: [id number] History of Present Illness: Kristina Strickland is a 32 year old white female, married in for UPT, has missed periods, had +FM and +HPTs. She has hypertension and has been on lisinopril, will stop that and Rx labetalol and get [place] ASAP.  She has had 2 prior C-sections.   Current Medications, Allergies, Past Medical History, Past Surgical History, Family History and Social History were reviewed in [employer] record.     Review of Systems: +missed periods +FM    Physical Exam:BP (!) 156/97 (BP Location: Left Arm, Patient Position: Sitting, Cuff Size: Normal)   Pulse (!) 116   Ht 5\' 3"  (1.6 m)   Wt 181 lb (82.1 kg)   LMP 09/28/2017 (Within Days)   BMI 32.06 kg/m  UPT +,about 32+3 by LMP with EDD 07/05/18. General:  Well developed, well nourished, no acute distress Skin:  Warm and dry Neck:  Midline trachea, normal thyroid, good ROM, no lymphadenopathy Lungs; Clear to auscultation bilaterally Cardiovascular: Regular rate and rhythm Abdomen:  Soft, non tender, FH 31 cm, with FHR 144 via doppler Psych:  No mood changes, alert and cooperative,seems happy PHQ 2 score 0. Fall risk is low. She is aware could be further along in pregnancy. She wants repeat  C-section.   Impression:  1. Pregnancy test positive   2. [redacted] weeks gestation of pregnancy   3. Antenatal screening for malformation using ultrasonics   4. Hypertension affecting pregnancy in second trimester   5. Late prenatal care affecting pregnancy in second trimester   6. History of 2 cesarean sections      Plan: Stop lisinopril and start labetalol  Meds ordered this encounter  Medications  . prenatal vitamin w/FE, FA (PRENATAL 1 + 1) 27-1 MG TABS tablet    Sig: Take 1 tablet by mouth daily at 12 noon.    Dispense:  30 each    Refill:  12    Order Specific Question:   Supervising Provider    Answer:   Despina Hidden, LUTHER H [[id number]]  .  labetalol (NORMODYNE) 200 MG tablet    Sig: Take 1 tablet (200 mg total) by mouth 2 (two) times daily.    Dispense:  60 tablet    Refill:  6    Order Specific Question:   Supervising Provider    Answer:   Lazaro Arms [[id number]]  Go apply for pregnancy medicaid Return 04/17/18 for anatomy US, then new ob the next week Review handouts on second and third trimester and by [facility]

## 2018-04-15 ENCOUNTER — Encounter (HOSPITAL_COMMUNITY): Payer: Self-pay

## 2018-04-15 ENCOUNTER — Emergency Department (HOSPITAL_COMMUNITY)
Admission: EM | Admit: 2018-04-15 | Discharge: 2018-04-15 | Disposition: A | Discharge status: Home / Self Care | Payer: Medicaid Other | Attending: Emergency Medicine | Admitting: Emergency Medicine

## 2018-04-15 DIAGNOSIS — R05 Cough: Secondary | ICD-10-CM | POA: Diagnosis not present

## 2018-04-15 DIAGNOSIS — Z5321 Procedure and treatment not carried out due to patient leaving prior to being seen by health care provider: Secondary | ICD-10-CM | POA: Insufficient documentation

## 2018-04-15 DIAGNOSIS — O99513 Diseases of the respiratory system complicating pregnancy, third trimester: Secondary | ICD-10-CM | POA: Insufficient documentation

## 2018-04-15 NOTE — ED Triage Notes (Signed)
Pt reports productive cough now is dry, nasal congestion, fever (although pt did not check at home) that started Saturday.  Pt is 7 months pregnant and is getting prenatal care, last seen less than a week ago.

## 2018-04-17 ENCOUNTER — Encounter: Payer: Self-pay | Admitting: Women's Health

## 2018-04-17 ENCOUNTER — Ambulatory Visit (INDEPENDENT_AMBULATORY_CARE_PROVIDER_SITE_OTHER): Payer: Medicaid Other | Admitting: Women's Health

## 2018-04-17 ENCOUNTER — Ambulatory Visit (INDEPENDENT_AMBULATORY_CARE_PROVIDER_SITE_OTHER): Payer: Medicaid Other

## 2018-04-17 VITALS — BP 170/106 | HR 88 | Wt 183.0 lb

## 2018-04-17 DIAGNOSIS — Z331 Pregnant state, incidental: Secondary | ICD-10-CM

## 2018-04-17 DIAGNOSIS — Z363 Encounter for antenatal screening for malformations: Secondary | ICD-10-CM

## 2018-04-17 DIAGNOSIS — Z3A34 34 weeks gestation of pregnancy: Secondary | ICD-10-CM

## 2018-04-17 DIAGNOSIS — R6889 Other general symptoms and signs: Secondary | ICD-10-CM

## 2018-04-17 DIAGNOSIS — F1190 Opioid use, unspecified, uncomplicated: Secondary | ICD-10-CM | POA: Insufficient documentation

## 2018-04-17 DIAGNOSIS — Z1389 Encounter for screening for other disorder: Secondary | ICD-10-CM

## 2018-04-17 DIAGNOSIS — O10919 Unspecified pre-existing hypertension complicating pregnancy, unspecified trimester: Secondary | ICD-10-CM

## 2018-04-17 DIAGNOSIS — J069 Acute upper respiratory infection, unspecified: Secondary | ICD-10-CM | POA: Diagnosis not present

## 2018-04-17 MED ORDER — AZITHROMYCIN 250 MG PO TABS: ORAL_TABLET | ORAL | 0 refills | Status: DC

## 2018-04-17 NOTE — Patient Instructions (Signed)
Increase labetalol to 400mg  (2 tablets) twice daily  Check your blood pressure 4 times daily and keep a log, bring this with you to all appointments.  If blood pressure is >=160 on top or >=110 on bottom, check again in 5 minutes, if still this high, call us or go to [facility] to be evaluated    Humidifier and saline nasal spray for nasal congestion  Regular robitussin, cough drops for cough  Warm salt water gargles for sore throat  Mucinex with lots of water to help you cough up the mucous in your chest if needed  Drink plenty of fluids and stay hydrated!  Wash your hands frequently.

## 2018-04-17 NOTE — Progress Notes (Signed)
   GYN VISIT Patient name: Kristina Strickland MRN [id number]  Date of birth: 11/08/1985 Chief Complaint:   URI  History of Present Illness:   Kristina Strickland is a 33 y.o. [phone] Caucasian female at 34.0wks by today's anatomy/dating u/s, being seen today as work-in for report of cold sx since Sat. Was exposed to flu on Saturday. Sx started w/ congestion 'head cold' then fever/chills, body aches, productive cough- then dry- now productive again, feels like she is starting to improve. Took vicodin yesterday for pain in ribs from coughing- states she has rx from PCP and takes 1-2 daily for pain in hands. CHTN was on lisinopril until 12/17 when she was switched to Labetalol 200mg  BID, did take this am, but has only been taking once daily, didn't know she was supposed to take BID. Has been taking Nyquil. New ob scheduled for 1/10.   Patient's last menstrual period was 09/28/2017 (approximate). Review of Systems:   Pertinent items are noted in HPI Denies fever/chills, dizziness, headaches, visual disturbances, fatigue, shortness of breath, chest pain, abdominal pain, vomiting, abnormal vaginal discharge/itching/odor/irritation, problems with periods, bowel movements, urination, or intercourse unless otherwise stated above.  Pertinent History Reviewed:  Reviewed past medical,surgical, social, obstetrical and family history.  Reviewed problem list, medications and allergies. Physical Assessment:   Vitals:   04/17/18 1501 04/17/18 1508  BP: (!) 159/87 (!) 170/106  Pulse: 88   Weight: 183 lb (83 kg)   Body mass index is 32.42 kg/m.       Physical Examination:   General appearance: alert, well appearing, and in no distress  Mental status: alert, oriented to person, place, and time  Skin: warm & dry   Throat: slightly erythematous, no exudate, + lymphadenopathy  Cardiovascular: normal heart rate noted, HRRR  Respiratory: normal respiratory effort, no distress, upper lobes clear, lower lobes + rhonchi  bilaterally, no wheezing  Abdomen: soft, non-tender   Pelvic: examination not indicated  Extremities: no edema   No results found for this or any previous visit (from the past 24 hour(s)).  Assessment & Plan:  1) [redacted]wks pregnant> by today's dating/anatomy u/s  2) Possible resolving flu/URI> too late for tamiflu, rx zpak, keep new ob appt 1/10 as scheduled  3) CHTN> increase Labetalol to 400mg  BID, check bp's QID at home and bring log  Meds:  Meds ordered this encounter  Medications  . azithromycin (ZITHROMAX Z-PAK) 250 MG tablet    Sig: Use as directed    Dispense:  6 each    Refill:  0    Order Specific Question:   Supervising Provider    Answer:   Duane Lope H [[id number]]    Orders Placed This Encounter  Procedures  . POC Urinalysis Dipstick OB    Return for As scheduled 1/10.  Cheral Marker CNM, [facility] 04/17/2018 3:31 PM

## 2018-04-17 NOTE — Progress Notes (Signed)
[place] 34 wks,cephalic,anterior placenta gr 3,bilat adnexa's wnl,efw 2453 g,fhr 132 bpm,anatomy complete,no obvious abnormalities,limited because of fetal age,EDD 05/29/2018

## 2018-04-25 ENCOUNTER — Ambulatory Visit: Payer: Self-pay | Admitting: *Deleted

## 2018-04-25 ENCOUNTER — Encounter: Payer: Self-pay | Admitting: Women's Health

## 2018-05-07 ENCOUNTER — Ambulatory Visit (INDEPENDENT_AMBULATORY_CARE_PROVIDER_SITE_OTHER): Payer: Medicaid Other | Admitting: Women's Health

## 2018-05-07 ENCOUNTER — Inpatient Hospital Stay (HOSPITAL_COMMUNITY)
Admission: AD | Admit: 2018-05-07 | Discharge: 2018-05-07 | Disposition: A | Discharge status: Home / Self Care | Payer: Medicaid Other | Source: Home / Self Care | Attending: Obstetrics and Gynecology | Admitting: Obstetrics and Gynecology

## 2018-05-07 ENCOUNTER — Other Ambulatory Visit: Payer: Self-pay | Admitting: Women's Health

## 2018-05-07 ENCOUNTER — Encounter: Payer: Self-pay | Admitting: Women's Health

## 2018-05-07 ENCOUNTER — Encounter (HOSPITAL_COMMUNITY): Payer: Self-pay | Admitting: Student

## 2018-05-07 ENCOUNTER — Other Ambulatory Visit: Payer: Self-pay

## 2018-05-07 ENCOUNTER — Encounter: Payer: Medicaid Other | Admitting: Women's Health

## 2018-05-07 VITALS — BP 181/114 | HR 117 | Wt 185.0 lb

## 2018-05-07 DIAGNOSIS — Z3A36 36 weeks gestation of pregnancy: Secondary | ICD-10-CM

## 2018-05-07 DIAGNOSIS — O10013 Pre-existing essential hypertension complicating pregnancy, third trimester: Secondary | ICD-10-CM | POA: Insufficient documentation

## 2018-05-07 DIAGNOSIS — O119 Pre-existing hypertension with pre-eclampsia, unspecified trimester: Secondary | ICD-10-CM | POA: Diagnosis not present

## 2018-05-07 DIAGNOSIS — O0993 Supervision of high risk pregnancy, unspecified, third trimester: Secondary | ICD-10-CM

## 2018-05-07 DIAGNOSIS — O10919 Unspecified pre-existing hypertension complicating pregnancy, unspecified trimester: Secondary | ICD-10-CM

## 2018-05-07 DIAGNOSIS — O0990 Supervision of high risk pregnancy, unspecified, unspecified trimester: Secondary | ICD-10-CM

## 2018-05-07 DIAGNOSIS — F17210 Nicotine dependence, cigarettes, uncomplicated: Secondary | ICD-10-CM | POA: Insufficient documentation

## 2018-05-07 DIAGNOSIS — O99333 Smoking (tobacco) complicating pregnancy, third trimester: Secondary | ICD-10-CM

## 2018-05-07 LAB — POCT URINALYSIS DIPSTICK OB
Glucose, UA: NEGATIVE
Ketones, UA: NEGATIVE
Nitrite, UA: NEGATIVE

## 2018-05-07 LAB — CBC
HCT: 37.8 % (ref 36.0–46.0)
Hemoglobin: 12.4 g/dL (ref 12.0–15.0)
MCH: 28.5 pg (ref 26.0–34.0)
MCHC: 32.8 g/dL (ref 30.0–36.0)
MCV: 86.9 fL (ref 80.0–100.0)
Platelets: 232 10*3/uL (ref 150–400)
RBC: 4.35 MIL/uL (ref 3.87–5.11)
RDW: 14 % (ref 11.5–15.5)
WBC: 10.5 10*3/uL (ref 4.0–10.5)
nRBC: 0 % (ref 0.0–0.2)

## 2018-05-07 LAB — COMPREHENSIVE METABOLIC PANEL
ALT: 17 U/L (ref 0–44)
AST: 23 U/L (ref 15–41)
Albumin: 2.9 g/dL — ABNORMAL LOW (ref 3.5–5.0)
Alkaline Phosphatase: 125 U/L (ref 38–126)
Anion gap: 8 (ref 5–15)
BUN: 8 mg/dL (ref 6–20)
CO2: 24 mmol/L (ref 22–32)
Calcium: 8.3 mg/dL — ABNORMAL LOW (ref 8.9–10.3)
Chloride: 103 mmol/L (ref 98–111)
Creatinine, Ser: 0.68 mg/dL (ref 0.44–1.00)
GFR calc Af Amer: >60 mL/min (ref >60)
GFR calc non Af Amer: >60 mL/min (ref >60)
Glucose, Bld: 80 mg/dL (ref 70–99)
Potassium: 2.9 mmol/L — ABNORMAL LOW (ref 3.5–5.1)
Sodium: 135 mmol/L (ref 135–145)
TOTAL PROTEIN: 6.4 g/dL — AB (ref 6.5–8.1)
Total Bilirubin: 0.8 mg/dL (ref 0.3–1.2)

## 2018-05-07 LAB — DIFFERENTIAL
Basophils Absolute: 0 10*3/uL (ref 0.0–0.1)
Basophils Relative: 0 %
Eosinophils Absolute: 0 10*3/uL (ref 0.0–0.5)
Eosinophils Relative: 0 %
Lymphocytes Relative: 13 %
Lymphs Abs: 1.4 10*3/uL (ref 0.7–4.0)
Monocytes Absolute: 0.8 10*3/uL (ref 0.1–1.0)
Monocytes Relative: 7 %
Neutro Abs: 8.2 10*3/uL — ABNORMAL HIGH (ref 1.7–7.7)
Neutrophils Relative %: 80 %

## 2018-05-07 LAB — RAPID URINE DRUG SCREEN, HOSP PERFORMED
Amphetamines: NOT DETECTED
Barbiturates: NOT DETECTED
Benzodiazepines: NOT DETECTED
Cocaine: NOT DETECTED
Opiates: NOT DETECTED
Tetrahydrocannabinol: NOT DETECTED

## 2018-05-07 LAB — TYPE AND SCREEN
ABO/RH(D): A POS
Antibody Screen: NEGATIVE

## 2018-05-07 LAB — PROTEIN / CREATININE RATIO, URINE
Creatinine, Urine: 305 mg/dL
Protein Creatinine Ratio: 0.36 mg/mg{Cre} — ABNORMAL HIGH (ref 0.00–0.15)
TOTAL PROTEIN, URINE: 111 mg/dL

## 2018-05-07 LAB — RAPID HIV SCREEN (HIV 1/2 AB+AG)
HIV 1/2 Antibodies: NONREACTIVE
HIV-1 P24 Antigen - HIV24: NONREACTIVE

## 2018-05-07 LAB — ABO/RH: ABO/RH(D): A POS

## 2018-05-07 LAB — HEPATITIS B SURFACE ANTIGEN: HEP B S AG: NEGATIVE

## 2018-05-07 MED ORDER — LACTATED RINGERS IV SOLN
INTRAVENOUS | Status: DC
  Administered 2018-05-07: 13:00:00 via INTRAVENOUS

## 2018-05-07 MED ORDER — LABETALOL HCL 200 MG PO TABS: 400.0000 mg | ORAL_TABLET | Freq: Two times a day (BID) | ORAL | 6 refills | Status: DC

## 2018-05-07 MED ORDER — LABETALOL HCL 5 MG/ML IV SOLN
40.0000 mg | INTRAVENOUS | Status: DC | PRN
  Administered 2018-05-07: 40 mg via INTRAVENOUS

## 2018-05-07 MED ORDER — LABETALOL HCL 5 MG/ML IV SOLN: 80.0000 mg | INTRAVENOUS | Status: DC | PRN

## 2018-05-07 MED ORDER — LABETALOL HCL 5 MG/ML IV SOLN
80.0000 mg | INTRAVENOUS | Status: DC | PRN
  Administered 2018-05-07: 80 mg via INTRAVENOUS
  Filled 2018-05-07: qty 16

## 2018-05-07 MED ORDER — LABETALOL HCL 100 MG PO TABS
400.0000 mg | ORAL_TABLET | Freq: Once | ORAL | Status: AC
  Administered 2018-05-07: 400 mg via ORAL
  Filled 2018-05-07: qty 4

## 2018-05-07 MED ORDER — HYDRALAZINE HCL 20 MG/ML IJ SOLN: 10.0000 mg | INTRAMUSCULAR | Status: DC | PRN

## 2018-05-07 MED ORDER — BETAMETHASONE SOD PHOS & ACET 6 (3-3) MG/ML IJ SUSP
12.0000 mg | Freq: Once | INTRAMUSCULAR | Status: AC
  Administered 2018-05-07: 12 mg via INTRAMUSCULAR
  Filled 2018-05-07: qty 2

## 2018-05-07 MED ORDER — LABETALOL HCL 5 MG/ML IV SOLN
20.0000 mg | INTRAVENOUS | Status: DC | PRN
  Administered 2018-05-07: 20 mg via INTRAVENOUS

## 2018-05-07 MED ORDER — LABETALOL HCL 5 MG/ML IV SOLN
20.0000 mg | INTRAVENOUS | Status: DC | PRN
  Filled 2018-05-07: qty 4

## 2018-05-07 MED ORDER — LABETALOL HCL 5 MG/ML IV SOLN
40.0000 mg | INTRAVENOUS | Status: DC | PRN
  Filled 2018-05-07: qty 8

## 2018-05-07 MED ORDER — HYDRALAZINE HCL 20 MG/ML IJ SOLN
10.0000 mg | INTRAMUSCULAR | Status: DC | PRN
  Administered 2018-05-07: 10 mg via INTRAVENOUS
  Filled 2018-05-07: qty 1

## 2018-05-07 NOTE — MAU Provider Note (Signed)
Reviewed chart, interviewed and examined patient.   She reports she has not had Labetalol for 4 days. Denies HA, RUQ pain. Reports seeing spots but this has been all the time even outside of pregnancy when her BP is elevated. She reports this is NOT worsening and is the same as when she is not pregnant. She reports edema that has been present for weeks, non-worsening.  BP was initially 180/114, received labetalol 20mg , 40mg , 80mg  and then hydralazine with improved BP to 140-150/80-90s.  Gen: well appearing, NAD HEENT: no scleral icterus CV: RR Lung: Normal WOB Abd: NTND  Ext: warm well perfused. Edema, trace Reflex 2+  Results for orders placed or performed during the hospital encounter of 05/07/18 (from the past 24 hour(s))  Protein / creatinine ratio, urine     Status: Abnormal   Collection Time: 05/07/18 12:33 PM  Result Value Ref Range   Creatinine, Urine 305.00 mg/dL   Total Protein, Urine 111 mg/dL   Protein Creatinine Ratio 0.36 (H) 0.00 - 0.15 mg/mg[Cre]  Urine rapid drug screen (hosp performed)not at [facility]     Status: None   Collection Time: 05/07/18 12:33 PM  Result Value Ref Range   Opiates NONE DETECTED NONE DETECTED   Cocaine NONE DETECTED NONE DETECTED   Benzodiazepines NONE DETECTED NONE DETECTED   Amphetamines NONE DETECTED NONE DETECTED   Tetrahydrocannabinol NONE DETECTED NONE DETECTED   Barbiturates NONE DETECTED NONE DETECTED  Comprehensive metabolic panel     Status: Abnormal   Collection Time: 05/07/18 12:43 PM  Result Value Ref Range   Sodium 135 135 - 145 mmol/L   Potassium 2.9 (L) 3.5 - 5.1 mmol/L   Chloride 103 98 - 111 mmol/L   CO2 24 22 - 32 mmol/L   Glucose, Bld 80 70 - 99 mg/dL   BUN 8 6 - 20 mg/dL   Creatinine, Ser 0.35 0.44 - 1.00 mg/dL   Calcium 8.3 (L) 8.9 - 10.3 mg/dL   Total Protein 6.4 (L) 6.5 - 8.1 g/dL   Albumin 2.9 (L) 3.5 - 5.0 g/dL   AST 23 15 - 41 U/L   ALT 17 0 - 44 U/L   Alkaline Phosphatase 125 38 - 126 U/L   Total Bilirubin  0.8 0.3 - 1.2 mg/dL   GFR calc non Af Amer >60 >60 mL/min   GFR calc Af Amer >60 >60 mL/min   Anion gap 8 5 - 15  CBC     Status: None   Collection Time: 05/07/18 12:43 PM  Result Value Ref Range   WBC 10.5 4.0 - 10.5 K/uL   RBC 4.35 3.87 - 5.11 MIL/uL   Hemoglobin 12.4 12.0 - 15.0 g/dL   HCT 00.9 38.1 - 82.9 %   MCV 86.9 80.0 - 100.0 fL   MCH 28.5 26.0 - 34.0 pg   MCHC 32.8 30.0 - 36.0 g/dL   RDW 93.7 16.9 - 67.8 %   Platelets 232 150 - 400 K/uL   nRBC 0.0 0.0 - 0.2 %  Differential     Status: Abnormal   Collection Time: 05/07/18 12:43 PM  Result Value Ref Range   Neutrophils Relative % 80 %   Neutro Abs 8.2 (H) 1.7 - 7.7 K/uL   Lymphocytes Relative 13 %   Lymphs Abs 1.4 0.7 - 4.0 K/uL   Monocytes Relative 7 %   Monocytes Absolute 0.8 0.1 - 1.0 K/uL   Eosinophils Relative 0 %   Eosinophils Absolute 0.0 0.0 - 0.5 K/uL  Basophils Relative 0 %   Basophils Absolute 0.0 0.0 - 0.1 K/uL  Rapid HIV screen (HIV 1/2 Ab+Ag)     Status: None   Collection Time: 05/07/18 12:43 PM  Result Value Ref Range   HIV-1 P24 Antigen - HIV24 NON REACTIVE NON REACTIVE   HIV 1/2 Antibodies NON REACTIVE NON REACTIVE   Interpretation (HIV Ag Ab)      A non reactive test result means that HIV 1 or HIV 2 antibodies and HIV 1 p24 antigen were not detected in the specimen.  Type and screen     Status: None   Collection Time: 05/07/18 12:43 PM  Result Value Ref Range   ABO/RH(D) A POS    Antibody Screen NEG    Sample Expiration      05/10/2018 Performed at [facility], [address], [place], [place] [address]   ABO/Rh     Status: None   Collection Time: 05/07/18 12:43 PM  Result Value Ref Range   ABO/RH(D)      A POS Performed at [facility], [address] [place], [place] [address]     Assessment: Kristina Strickland is 33 y.o. [id number] at [redacted]w[redacted]d presenting for elevated blood pressure in the setting of non-compliance with medications.  She does have an elevated UPC but she never had  this done at baseline due to recently establishing prenatal care at [redacted]w[redacted]d. She received the entire preeclampsia protocol and Labetalol 400mg  POx1 with good deescalating blood pressure (130-140s/90s at discharge)   Discussed that this appears to be her chronic HTN that is uncontrolled due to medication non-adherence.   Reviewed options for discharge home with plan to obtain/assure medications are available to the patient and close follow up tomorrow for scheduled CS with BTL  Reviewed preeclampsia s/sx in detail with patient  Reviewed with FT provider and will schedule CS for 37 weeks (tomorrow)

## 2018-05-07 NOTE — MAU Note (Signed)
Pt sent from MD office for elevated BP, denies HA but is seeing spots, no epigastric pain.  Pt states she may be having occasional uc's, denies bleeding or LOF.  Reports good fetal movement.

## 2018-05-07 NOTE — Progress Notes (Signed)
INITIAL OBSTETRICAL VISIT Patient name: Kristina Strickland MRN [id number]  Date of birth: 12-28-1985 Chief Complaint:   Initial Prenatal Visit  History of Present Illness:   Kristina Strickland is a 33 y.o. G18P2012 Caucasian female at [redacted]w[redacted]d by 34wk u/s, with an Estimated Date of Delivery: 05/29/18 being seen today for her initial obstetrical visit.   Her obstetrical history is significant for AB x 1, Prev c/s x 2- 1st: 'pelvis too small', 2nd: elective repeat.   CHTN on Lisinopril until 12/17 at pregnancy confirmation visit, at which time she was switched to Labetalol 200mg  BID. She was seen as a work-in on 1/2 for URI at which time Labetalol was increased to 400mg  BID- states she ran out of it about 4 days ago. Has been feeling 'off' for about a week, no headaches, has been seeing spots, no ruq/epigastric pain. Daily vicodin use for pain in hands, 1-2/day.  Patient's last menstrual period was 09/28/2017 (approximate). Review of Systems:   Pertinent items are noted in HPI Denies cramping/contractions, leakage of fluid, vaginal bleeding, abnormal vaginal discharge w/ itching/odor/irritation, headaches, visual changes, shortness of breath, chest pain, abdominal pain, severe nausea/vomiting, or problems with urination or bowel movements unless otherwise stated above.  Pertinent History Reviewed:  Reviewed past medical,surgical, social, obstetrical and family history.  Reviewed problem list, medications and allergies. OB History  Gravida Para Term Preterm AB Living  4 2 2   1 2   SAB TAB Ectopic Multiple Live Births          2    # Outcome Date GA Lbr Len/2nd Weight Sex Delivery Anes PTL Lv  4 Current           3 Term 03/28/10 [redacted]w[redacted]d  7 lb 3 oz (3.26 kg) M CS-Unspec   LIV  2 Term 01/15/09 [redacted]w[redacted]d  6 lb 12 oz (3.062 kg) F CS-Unspec   LIV  1 AB 2002 [redacted]w[redacted]d          Physical Assessment:   Vitals:   05/07/18 1013  BP: (!) 181/114  Pulse: (!) 117  Weight: 185 lb (83.9 kg)  Body mass index is 32.77  kg/m.       Physical Examination:  General appearance - well appearing, and in no distress  Mental status - alert, oriented to person, place, and time  Psych:  She has a normal mood and affect  Skin - warm and dry, normal color, no suspicious lesions noted  Chest - effort normal, all lung fields clear to auscultation bilaterally  Heart - normal rate and regular rhythm  Abdomen - soft, nontender, gravid  Extremities:  1+, no varicosities noted, DTRs 2-3+, no clonus  FH: 36cm Fetal Heart Rate (bpm): 135 via doppler  Results for orders placed or performed in visit on 05/07/18 (from the past 24 hour(s))  POC Urinalysis Dipstick OB   Collection Time: 05/07/18 10:20 AM  Result Value Ref Range   Color, UA     Clarity, UA     Glucose, UA Negative Negative   Bilirubin, UA     Ketones, UA neg    Spec Grav, UA     Blood, UA small    pH, UA     POC,PROTEIN,UA Small (1+) Negative, Trace, Small (1+), Moderate (2+), Large (3+), 4+   Urobilinogen, UA     Nitrite, UA neg    Leukocytes, UA Small (1+) (A) Negative   Appearance     Odor      Assessment & Plan:  1) High-Risk Pregnancy [id number] at [redacted]w[redacted]d with an Estimated Date of Delivery: 05/29/18   2) Initial OB visit  3) CHTN w/ superimposed severe pre-e> based on bp, proteinuria, and seeing spots. To MAU now, notified Gerrit Heck, CNM  Meds: No orders of the defined types were placed in this encounter.  Initial labs NOT obtained- will get at hospital Genetic Screening discussed: too late Cystic fibrosis screening discussed initially requested, however after she was seen and disposition is hospital, declines Ultrasound discussed; fetal survey: results reviewed CCNC completed  Follow-up: Return for TBD.   Orders Placed This Encounter  Procedures  . Urine Culture  . Urinalysis, Routine w reflex microscopic    Cheral Marker CNM, [facility] 05/07/2018 10:54 AM

## 2018-05-07 NOTE — MAU Provider Note (Signed)
Chief Complaint:  Hypertension   First Provider Initiated Contact with Patient 05/07/18 1249     HPI: Kristina Strickland is a 33 y.o. [id number] at [redacted]w[redacted]d who presents to maternity admissions reporting hypertension. Was seen today for her first OB visit. Has history of chronic hypertension. Was on lisinopril until her pregnancy confirmation in December when she was switched to labetalol. Was up to 400 mg BID but ran out of her Rx and hasn't taken it in the last 4 days. States she has felt "off" and sluggish since not taking it. Denies headache but does endorse some blurred vision. Denies epigastric pain.  Denies contractions, leakage of fluid or vaginal bleeding. Good fetal movement.    Past Medical History:  Diagnosis Date  . Constipation 12/15/2014  . Contraceptive management 08/11/2013  . History of chicken pox   . HPV (human papilloma virus) infection   . Hypertension   . Irregular intermenstrual bleeding 11/09/2014  . Kidney stones   . Migraines   . Pelvic pain in female 12/15/2014  . Rubella immune   . Sponge kidney   . Vaginal Pap smear, abnormal    OB History  Gravida Para Term Preterm AB Living  4 2 2   1 2   SAB TAB Ectopic Multiple Live Births          2    # Outcome Date GA Lbr Len/2nd Weight Sex Delivery Anes PTL Lv  4 Current           3 Term 03/28/10 [redacted]w[redacted]d  3260 g M CS-Unspec   LIV  2 Term 01/15/09 [redacted]w[redacted]d  3062 g F CS-Unspec   LIV  1 AB 2002 [redacted]w[redacted]d          Past Surgical History:  Procedure Laterality Date  . APPENDECTOMY    . CESAREAN SECTION     C/S x 2   Family History  Problem Relation Age of Onset  . Hypertension Mother   . Migraines Mother   . Hypertension Father   . Migraines Maternal Grandmother   . Hypertension Maternal Grandfather   . Hypertension Paternal Grandfather   . Kidney Stones Brother    Social History   Tobacco Use  . Smoking status: Light Tobacco Smoker    Packs/day: 0.25    Types: Cigarettes  . Smokeless tobacco: Never Used  Substance  Use Topics  . Alcohol use: No  . Drug use: Yes    Comment: vicodin   Allergies  Allergen Reactions  . Metronidazole Rash  . Penicillins Swelling and Rash    Did it involve swelling of the face/tongue/throat, SOB, or low BP? Yes Did it involve sudden or severe rash/hives, skin peeling, or any reaction on the inside of your mouth or nose? Yes Did you need to seek medical attention at a hospital or doctor's office? Yes When did it last happen?More than 10 years If all above answers are "NO", may proceed with cephalosporin use.    Medications Prior to Admission  Medication Sig Dispense Refill Last Dose  . acetaminophen (TYLENOL) 500 MG tablet Take 1,000 mg by mouth every 6 (six) hours as needed for mild pain or headache.   Past Week at Unknown time  . labetalol (NORMODYNE) 200 MG tablet Take 1 tablet (200 mg total) by mouth 2 (two) times daily. (Patient taking differently: Take 200 mg by mouth 4 (four) times daily. ) 60 tablet 6 Past Week at Unknown time  . loratadine (CLARITIN) 10 MG tablet Take 10 mg by  mouth daily as needed for allergies.   05/07/2018 at Unknown time  . omeprazole (PRILOSEC) 20 MG capsule Take 20 mg by mouth daily.   05/06/2018 at Unknown time  . prenatal vitamin w/FE, FA (PRENATAL 1 + 1) 27-1 MG TABS tablet Take 1 tablet by mouth daily at 12 noon. 30 each 12 05/06/2018 at Unknown time    I have reviewed patient's Past Medical Hx, Surgical Hx, Family Hx, Social Hx, medications and allergies.   ROS:  Review of Systems  Constitutional: Negative.   Eyes: Positive for visual disturbance.  Gastrointestinal: Negative.   Genitourinary: Negative.   Neurological: Negative for headaches.    Physical Exam   Patient Vitals for the past 24 hrs:  BP Temp Temp src Pulse Resp  05/07/18 1416 (!) 153/95 - - 84 -  05/07/18 1401 (!) 146/83 - - 89 -  05/07/18 1346 (!) 172/98 - - 80 -  05/07/18 1331 (!) 148/92 - - 85 -  05/07/18 1316 (!) 162/113 - - 92 -  05/07/18 1301 (!)  149/107 - - 93 -  05/07/18 1246 (!) 162/119 - - (!) 105 -  05/07/18 1240 (!) 174/122 98 F (36.7 C) Oral (!) 107 18    Constitutional: Well-developed, well-nourished female in no acute distress.  Cardiovascular: normal rate & rhythm, no murmur Respiratory: normal effort, lung sounds clear throughout GI: Abd soft, non-tender, gravid appropriate for gestational age. Pos BS x 4 MS: Extremities nontender, no edema, normal ROM.  Neurologic: Alert and oriented x 4. DTR 2+ BLE, no clonus  NST:  Baseline: 110 bpm, Variability: Good {> 6 bpm), Accelerations: Reactive and Decelerations: Absent   Labs: Results for orders placed or performed during the hospital encounter of 05/07/18 (from the past 24 hour(s))  Protein / creatinine ratio, urine     Status: Abnormal   Collection Time: 05/07/18 12:33 PM  Result Value Ref Range   Creatinine, Urine 305.00 mg/dL   Total Protein, Urine 111 mg/dL   Protein Creatinine Ratio 0.36 (H) 0.00 - 0.15 mg/mg[Cre]  Comprehensive metabolic panel     Status: Abnormal   Collection Time: 05/07/18 12:43 PM  Result Value Ref Range   Sodium 135 135 - 145 mmol/L   Potassium 2.9 (L) 3.5 - 5.1 mmol/L   Chloride 103 98 - 111 mmol/L   CO2 24 22 - 32 mmol/L   Glucose, Bld 80 70 - 99 mg/dL   BUN 8 6 - 20 mg/dL   Creatinine, Ser 3.25 0.44 - 1.00 mg/dL   Calcium 8.3 (L) 8.9 - 10.3 mg/dL   Total Protein 6.4 (L) 6.5 - 8.1 g/dL   Albumin 2.9 (L) 3.5 - 5.0 g/dL   AST 23 15 - 41 U/L   ALT 17 0 - 44 U/L   Alkaline Phosphatase 125 38 - 126 U/L   Total Bilirubin 0.8 0.3 - 1.2 mg/dL   GFR calc non Af Amer >60 >60 mL/min   GFR calc Af Amer >60 >60 mL/min   Anion gap 8 5 - 15  CBC     Status: None   Collection Time: 05/07/18 12:43 PM  Result Value Ref Range   WBC 10.5 4.0 - 10.5 K/uL   RBC 4.35 3.87 - 5.11 MIL/uL   Hemoglobin 12.4 12.0 - 15.0 g/dL   HCT 49.8 26.4 - 15.8 %   MCV 86.9 80.0 - 100.0 fL   MCH 28.5 26.0 - 34.0 pg   MCHC 32.8 30.0 - 36.0 g/dL   RDW  14.0 11.5 -  15.5 %   Platelets 232 150 - 400 K/uL   nRBC 0.0 0.0 - 0.2 %  Differential     Status: Abnormal   Collection Time: 05/07/18 12:43 PM  Result Value Ref Range   Neutrophils Relative % 80 %   Neutro Abs 8.2 (H) 1.7 - 7.7 K/uL   Lymphocytes Relative 13 %   Lymphs Abs 1.4 0.7 - 4.0 K/uL   Monocytes Relative 7 %   Monocytes Absolute 0.8 0.1 - 1.0 K/uL   Eosinophils Relative 0 %   Eosinophils Absolute 0.0 0.0 - 0.5 K/uL   Basophils Relative 0 %   Basophils Absolute 0.0 0.0 - 0.1 K/uL  Rapid HIV screen (HIV 1/2 Ab+Ag)     Status: None   Collection Time: 05/07/18 12:43 PM  Result Value Ref Range   HIV-1 P24 Antigen - HIV24 NON REACTIVE NON REACTIVE   HIV 1/2 Antibodies NON REACTIVE NON REACTIVE   Interpretation (HIV Ag Ab)      A non reactive test result means that HIV 1 or HIV 2 antibodies and HIV 1 p24 antigen were not detected in the specimen.  Type and screen     Status: None   Collection Time: 05/07/18 12:43 PM  Result Value Ref Range   ABO/RH(D) A POS    Antibody Screen NEG    Sample Expiration      05/10/2018 Performed at [facility], [address] [place], [place] [address]     Imaging:  No results found.  MAU Course: Orders Placed This Encounter  Procedures  . Protein / creatinine ratio, urine  . Urine rapid drug screen (hosp performed)not at [facility]  . Comprehensive metabolic panel  . CBC  . Hepatitis B surface antigen  . Rubella screen  . RPR  . Differential  . Rapid HIV screen (HIV 1/2 Ab+Ag)  . Vital signs  . Notify Physician  . Measure blood pressure  . Fetal monitoring  . Continuous tocometry  . Type and screen  . ABO/Rh   Meds ordered this encounter  Medications  . AND Linked Order Group   . labetalol (NORMODYNE,TRANDATE) injection 20 mg   . labetalol (NORMODYNE,TRANDATE) injection 40 mg   . labetalol (NORMODYNE,TRANDATE) injection 80 mg   . hydrALAZINE (APRESOLINE) injection 10 mg  . betamethasone acetate-betamethasone sodium phosphate  (CELESTONE) injection 12 mg  . DISCONTD: labetalol (NORMODYNE,TRANDATE) injection 20 mg  . DISCONTD: labetalol (NORMODYNE,TRANDATE) injection 40 mg  . DISCONTD: labetalol (NORMODYNE,TRANDATE) injection 80 mg  . DISCONTD: hydrALAZINE (APRESOLINE) injection 10 mg  . lactated ringers infusion    MDM: Labs placed by Dr. Alvester Morin Pt with severe range BPs in MAU. IV antihypertensive protocol ordered Patient given 3 doses of IV labetalol.  Urine PCR 0.36 Platelets & LFT wnl  Dr. Alvester Morin will come speak with patient regarding plan of care   Judeth Horn, NP 05/07/2018 2:20 PM

## 2018-05-07 NOTE — Discharge Instructions (Signed)
Come to [facility] at 5:30 AM for your scheduled C-section which is at 730 AM with Dr. Despina Hidden  Do no eat  after midnight  Take your Labetalol in the morning with a sip of water only      Cesarean Delivery Cesarean birth, or cesarean delivery, is the surgical delivery of a baby through an incision in the abdomen and the uterus. This may be referred to as a C-section. This procedure may be scheduled ahead of time, or it may be done in an emergency situation. Tell a health care provider about:  Any allergies you have.  All medicines you are taking, including vitamins, herbs, eye drops, creams, and over-the-counter medicines.  Any problems you or family members have had with anesthetic medicines.  Any blood disorders you have.  Any surgeries you have had.  Any medical conditions you have.  Whether you or any members of your family have a history of deep vein thrombosis (DVT) or pulmonary embolism (PE). What are the risks? Generally, this is a safe procedure. However, problems may occur, including:  Infection.  Bleeding.  Allergic reactions to medicines.  Damage to other structures or organs.  Blood clots.  Injury to your baby. What happens before the procedure? General instructions  Follow instructions from your health care provider about eating or drinking restrictions.  If you know that you are going to have a cesarean delivery, do not shave your pubic area. Shaving before the procedure may increase your risk of infection.  Plan to have someone take you home from the hospital.  Ask your health care provider what steps will be taken to prevent infection. These may include: ? Removing hair at the surgery site. ? Washing skin with a germ-killing soap. ? Taking antibiotic medicine.  Depending on the reason for your cesarean delivery, you may have a physical exam or additional testing, such as an ultrasound.  You may have your blood or urine tested. Questions  for your health care provider  Ask your health care provider about: ? Changing or stopping your regular medicines. This is especially important if you are taking diabetes medicines or blood thinners. ? Your pain management plan. This is especially important if you plan to breastfeed your baby. ? How long you will be in the hospital after the procedure. ? Any concerns you may have about receiving blood products, if you need them during the procedure. ? Cord blood banking, if you plan to collect your baby's umbilical cord blood.  You may also want to ask your health care provider: ? Whether you will be able to hold or breastfeed your baby while you are still in the operating room. ? Whether your baby can stay with you immediately after the procedure and during your recovery. ? Whether a family member or a person of your choice can go with you into the operating room and stay with you during the procedure, immediately after the procedure, and during your recovery. What happens during the procedure?   An IV will be inserted into one of your veins.  Fluid and medicines, such as antibiotics, will be given before the surgery.  Fetal monitors will be placed on your abdomen to check your baby's heart rate.  You may be given a special warming gown to wear to keep your temperature stable.  A catheter may be inserted into your bladder through your urethra. This drains your urine during the procedure.  You may be given one or more of the following: ?  A medicine to numb the area (local anesthetic). ? A medicine to make you fall asleep (general anesthetic). ? A medicine (regional anesthetic) that is injected into your back or through a small thin tube placed in your back (spinal anesthetic or epidural anesthetic). This numbs everything below the injection site and allows you to stay awake during your procedure. If this makes you feel nauseous, tell your health care provider. Medicines will be available  to help reduce any nausea you may feel.  An incision will be made in your abdomen, and then in your uterus.  If you are awake during your procedure, you may feel tugging and pulling in your abdomen, but you should not feel pain. If you feel pain, tell your health care provider immediately.  Your baby will be removed from your uterus. You may feel more pressure or pushing while this happens.  Immediately after birth, your baby will be dried and kept warm. You may be able to hold and breastfeed your baby.  The umbilical cord may be clamped and cut during this time. This usually occurs after waiting a period of 1-2 minutes after delivery.  Your placenta will be removed from your uterus.  Your incisions will be closed with stitches (sutures). Staples, skin glue, or adhesive strips may also be applied to the incision in your abdomen.  Bandages (dressings) may be placed over the incision in your abdomen. The procedure may vary among health care providers and hospitals. What happens after the procedure?  Your blood pressure, heart rate, breathing rate, and blood oxygen level will be monitored until you are discharged from the hospital.  You may continue to receive fluids and medicines through an IV.  You will have some pain. Medicines will be available to help control your pain.  To help prevent blood clots: ? You may be given medicines. ? You may have to wear compression stockings or devices. ? You will be encouraged to walk around when you are able.  Hospital staff will encourage and support bonding with your baby. Your hospital may have you and your baby to stay in the same room (rooming in) during your hospital stay to encourage successful bonding and breastfeeding.  You may be encouraged to cough and breathe deeply often. This helps to prevent lung problems.  If you have a catheter draining your urine, it will be removed as soon as possible after your procedure. Summary  Cesarean  birth, or cesarean delivery, is the surgical delivery of a baby through an incision in the abdomen and the uterus.  Follow instructions from your health care provider about eating or drinking restrictions before the procedure.  You will have some pain after the procedure. Medicines will be available to help control your pain.  Hospital staff will encourage and support bonding with your baby after the procedure. Your hospital may have you and your baby to stay in the same room (rooming in) during your hospital stay to encourage successful bonding and breastfeeding. This information is not intended to replace advice given to you by your health care provider. Make sure you discuss any questions you have with your health care provider. Document Released: 04/02/2005 Document Revised: 10/07/2017 Document Reviewed: 10/07/2017 Elsevier Interactive Patient Education  2019 [employer].

## 2018-05-08 ENCOUNTER — Inpatient Hospital Stay (HOSPITAL_COMMUNITY)
Admission: RE | Admit: 2018-05-08 | Discharge: 2018-05-14 | DRG: 784 | Disposition: A | Discharge status: Home / Self Care | Payer: Medicaid Other | Attending: Obstetrics & Gynecology | Admitting: Obstetrics & Gynecology

## 2018-05-08 ENCOUNTER — Inpatient Hospital Stay (HOSPITAL_COMMUNITY): Payer: Medicaid Other | Admitting: Anesthesiology

## 2018-05-08 ENCOUNTER — Encounter (HOSPITAL_COMMUNITY): Payer: Self-pay | Admitting: *Deleted

## 2018-05-08 ENCOUNTER — Other Ambulatory Visit: Payer: Self-pay | Admitting: Obstetrics and Gynecology

## 2018-05-08 ENCOUNTER — Encounter (HOSPITAL_COMMUNITY)
Admission: RE | Disposition: A | Discharge status: Home / Self Care | Payer: Self-pay | Source: Home / Self Care | Attending: Obstetrics & Gynecology

## 2018-05-08 ENCOUNTER — Other Ambulatory Visit: Payer: Self-pay | Admitting: Obstetrics & Gynecology

## 2018-05-08 DIAGNOSIS — Z3A37 37 weeks gestation of pregnancy: Secondary | ICD-10-CM

## 2018-05-08 DIAGNOSIS — O34219 Maternal care for unspecified type scar from previous cesarean delivery: Secondary | ICD-10-CM | POA: Diagnosis present

## 2018-05-08 DIAGNOSIS — O99334 Smoking (tobacco) complicating childbirth: Secondary | ICD-10-CM | POA: Diagnosis present

## 2018-05-08 DIAGNOSIS — O99330 Smoking (tobacco) complicating pregnancy, unspecified trimester: Secondary | ICD-10-CM | POA: Diagnosis present

## 2018-05-08 DIAGNOSIS — Z302 Encounter for sterilization: Secondary | ICD-10-CM

## 2018-05-08 DIAGNOSIS — Z3009 Encounter for other general counseling and advice on contraception: Secondary | ICD-10-CM | POA: Diagnosis present

## 2018-05-08 DIAGNOSIS — O10919 Unspecified pre-existing hypertension complicating pregnancy, unspecified trimester: Secondary | ICD-10-CM | POA: Diagnosis present

## 2018-05-08 DIAGNOSIS — F17210 Nicotine dependence, cigarettes, uncomplicated: Secondary | ICD-10-CM | POA: Diagnosis present

## 2018-05-08 DIAGNOSIS — O34211 Maternal care for low transverse scar from previous cesarean delivery: Secondary | ICD-10-CM | POA: Diagnosis present

## 2018-05-08 DIAGNOSIS — F1120 Opioid dependence, uncomplicated: Secondary | ICD-10-CM | POA: Diagnosis present

## 2018-05-08 DIAGNOSIS — Z98891 History of uterine scar from previous surgery: Secondary | ICD-10-CM

## 2018-05-08 DIAGNOSIS — O99324 Drug use complicating childbirth: Secondary | ICD-10-CM | POA: Diagnosis present

## 2018-05-08 DIAGNOSIS — O113 Pre-existing hypertension with pre-eclampsia, third trimester: Secondary | ICD-10-CM | POA: Diagnosis not present

## 2018-05-08 DIAGNOSIS — O114 Pre-existing hypertension with pre-eclampsia, complicating childbirth: Secondary | ICD-10-CM | POA: Diagnosis present

## 2018-05-08 DIAGNOSIS — O0930 Supervision of pregnancy with insufficient antenatal care, unspecified trimester: Secondary | ICD-10-CM

## 2018-05-08 DIAGNOSIS — O1002 Pre-existing essential hypertension complicating childbirth: Secondary | ICD-10-CM | POA: Diagnosis present

## 2018-05-08 LAB — CBC
HCT: 33 % — ABNORMAL LOW (ref 36.0–46.0)
HEMOGLOBIN: 10.9 g/dL — AB (ref 12.0–15.0)
MCH: 28.8 pg (ref 26.0–34.0)
MCHC: 33 g/dL (ref 30.0–36.0)
MCV: 87.1 fL (ref 80.0–100.0)
Platelets: 219 10*3/uL (ref 150–400)
RBC: 3.79 MIL/uL — ABNORMAL LOW (ref 3.87–5.11)
RDW: 14.4 % (ref 11.5–15.5)
WBC: 15 10*3/uL — ABNORMAL HIGH (ref 4.0–10.5)

## 2018-05-08 LAB — CREATININE, SERUM
Creatinine, Ser: 0.74 mg/dL (ref 0.44–1.00)
GFR calc Af Amer: >60 mL/min (ref >60)
GFR calc non Af Amer: >60 mL/min (ref >60)

## 2018-05-08 LAB — RPR: RPR Ser Ql: NONREACTIVE

## 2018-05-08 LAB — RUBELLA SCREEN: Rubella: 1.23 index (ref >0.99)

## 2018-05-08 SURGERY — Surgical Case
Anesthesia: Spinal | Site: Abdomen | Laterality: Bilateral | Wound class: Clean Contaminated

## 2018-05-08 MED ORDER — SCOPOLAMINE 1 MG/3DAYS TD PT72
1.0000 | MEDICATED_PATCH | Freq: Once | TRANSDERMAL | Status: AC
  Administered 2018-05-08: 1 via TRANSDERMAL

## 2018-05-08 MED ORDER — KETOROLAC TROMETHAMINE 30 MG/ML IJ SOLN: 30.0000 mg | Freq: Four times a day (QID) | INTRAMUSCULAR | Status: AC | PRN

## 2018-05-08 MED ORDER — LACTATED RINGERS IV SOLN
INTRAVENOUS | Status: DC
  Administered 2018-05-08: 08:00:00 via INTRAVENOUS

## 2018-05-08 MED ORDER — NALBUPHINE HCL 10 MG/ML IJ SOLN: 5.0000 mg | Freq: Once | INTRAMUSCULAR | Status: DC | PRN

## 2018-05-08 MED ORDER — PHENYLEPHRINE 8 MG IN D5W 100 ML (0.08MG/ML) PREMIX OPTIME
INJECTION | INTRAVENOUS | Status: AC
  Filled 2018-05-08: qty 100

## 2018-05-08 MED ORDER — OXYTOCIN 40 UNITS IN NORMAL SALINE INFUSION - SIMPLE MED
2.5000 [IU]/h | INTRAVENOUS | Status: AC
  Administered 2018-05-08: 2.5 [IU]/h via INTRAVENOUS

## 2018-05-08 MED ORDER — FENTANYL CITRATE (PF) 100 MCG/2ML IJ SOLN
INTRAMUSCULAR | Status: DC | PRN
  Administered 2018-05-08: 15 ug via INTRAVENOUS

## 2018-05-08 MED ORDER — LACTATED RINGERS IV SOLN
INTRAVENOUS | Status: DC | PRN
  Administered 2018-05-08: 08:00:00 via INTRAVENOUS

## 2018-05-08 MED ORDER — WITCH HAZEL-GLYCERIN EX PADS: 1.0000 "application " | MEDICATED_PAD | CUTANEOUS | Status: DC | PRN

## 2018-05-08 MED ORDER — NALOXONE HCL 0.4 MG/ML IJ SOLN: 0.4000 mg | INTRAMUSCULAR | Status: DC | PRN

## 2018-05-08 MED ORDER — AMLODIPINE BESYLATE 10 MG PO TABS
10.0000 mg | ORAL_TABLET | Freq: Every day | ORAL | Status: DC
  Administered 2018-05-08 – 2018-05-14 (×7): 10 mg via ORAL
  Filled 2018-05-08 (×10): qty 1

## 2018-05-08 MED ORDER — BUPIVACAINE IN DEXTROSE 0.75-8.25 % IT SOLN
INTRATHECAL | Status: DC | PRN
  Administered 2018-05-08: 1.5 mL via INTRATHECAL

## 2018-05-08 MED ORDER — KETOROLAC TROMETHAMINE 30 MG/ML IJ SOLN
30.0000 mg | Freq: Four times a day (QID) | INTRAMUSCULAR | Status: AC
  Administered 2018-05-08 – 2018-05-09 (×4): 30 mg via INTRAVENOUS
  Filled 2018-05-08 (×4): qty 1

## 2018-05-08 MED ORDER — CEFAZOLIN SODIUM-DEXTROSE 2-4 GM/100ML-% IV SOLN: 2.0000 g | INTRAVENOUS | Status: DC

## 2018-05-08 MED ORDER — OXYTOCIN 10 UNIT/ML IJ SOLN
INTRAVENOUS | Status: DC | PRN
  Administered 2018-05-08: 40 [IU] via INTRAVENOUS

## 2018-05-08 MED ORDER — FENTANYL CITRATE (PF) 100 MCG/2ML IJ SOLN: 25.0000 ug | INTRAMUSCULAR | Status: DC | PRN

## 2018-05-08 MED ORDER — ACETAMINOPHEN 500 MG PO TABS
1000.0000 mg | ORAL_TABLET | Freq: Four times a day (QID) | ORAL | Status: AC
  Administered 2018-05-08 – 2018-05-09 (×4): 1000 mg via ORAL
  Filled 2018-05-08 (×4): qty 2

## 2018-05-08 MED ORDER — MORPHINE SULFATE (PF) 0.5 MG/ML IJ SOLN
INTRAMUSCULAR | Status: DC | PRN
  Administered 2018-05-08: 150 ug via INTRATHECAL

## 2018-05-08 MED ORDER — SCOPOLAMINE 1 MG/3DAYS TD PT72
MEDICATED_PATCH | TRANSDERMAL | Status: AC
  Filled 2018-05-08: qty 1

## 2018-05-08 MED ORDER — TETANUS-DIPHTH-ACELL PERTUSSIS 5-2.5-18.5 LF-MCG/0.5 IM SUSP
0.5000 mL | Freq: Once | INTRAMUSCULAR | Status: AC
  Administered 2018-05-09: 0.5 mL via INTRAMUSCULAR
  Filled 2018-05-08: qty 0.5

## 2018-05-08 MED ORDER — IBUPROFEN 600 MG PO TABS
600.0000 mg | ORAL_TABLET | Freq: Four times a day (QID) | ORAL | Status: DC | PRN
  Administered 2018-05-09 – 2018-05-12 (×13): 600 mg via ORAL
  Filled 2018-05-08 (×13): qty 1

## 2018-05-08 MED ORDER — OXYCODONE-ACETAMINOPHEN 5-325 MG PO TABS
1.0000 | ORAL_TABLET | ORAL | Status: DC | PRN
  Administered 2018-05-09 – 2018-05-11 (×7): 2 via ORAL
  Administered 2018-05-12: 1 via ORAL
  Administered 2018-05-12 (×2): 2 via ORAL
  Administered 2018-05-13: 1 via ORAL
  Administered 2018-05-13: 2 via ORAL
  Administered 2018-05-13 – 2018-05-14 (×2): 1 via ORAL
  Filled 2018-05-08: qty 2
  Filled 2018-05-08 (×2): qty 1
  Filled 2018-05-08 (×4): qty 2
  Filled 2018-05-08: qty 1
  Filled 2018-05-08: qty 2
  Filled 2018-05-08: qty 1
  Filled 2018-05-08 (×5): qty 2

## 2018-05-08 MED ORDER — LABETALOL HCL 200 MG PO TABS: 400.0000 mg | ORAL_TABLET | Freq: Two times a day (BID) | ORAL | Status: DC

## 2018-05-08 MED ORDER — SODIUM CHLORIDE 0.9% FLUSH: 3.0000 mL | INTRAVENOUS | Status: DC | PRN

## 2018-05-08 MED ORDER — LACTATED RINGERS IV SOLN: INTRAVENOUS | Status: DC

## 2018-05-08 MED ORDER — PRENATAL MULTIVITAMIN CH
1.0000 | ORAL_TABLET | Freq: Every day | ORAL | Status: DC
  Administered 2018-05-09 – 2018-05-14 (×6): 1 via ORAL
  Filled 2018-05-08 (×6): qty 1

## 2018-05-08 MED ORDER — COCONUT OIL OIL
1.0000 "application " | TOPICAL_OIL | Status: DC | PRN
  Administered 2018-05-14: 1 via TOPICAL
  Filled 2018-05-08: qty 120

## 2018-05-08 MED ORDER — SIMETHICONE 80 MG PO CHEW
80.0000 mg | CHEWABLE_TABLET | ORAL | Status: DC | PRN
  Filled 2018-05-08: qty 1

## 2018-05-08 MED ORDER — ACETAMINOPHEN 500 MG PO TABS: 1000.0000 mg | ORAL_TABLET | Freq: Four times a day (QID) | ORAL | Status: DC

## 2018-05-08 MED ORDER — STERILE WATER FOR IRRIGATION IR SOLN
Status: DC | PRN
  Administered 2018-05-08: 1000 mL

## 2018-05-08 MED ORDER — NALBUPHINE HCL 10 MG/ML IJ SOLN: 5.0000 mg | INTRAMUSCULAR | Status: DC | PRN

## 2018-05-08 MED ORDER — DIPHENHYDRAMINE HCL 25 MG PO CAPS
25.0000 mg | ORAL_CAPSULE | Freq: Four times a day (QID) | ORAL | Status: DC | PRN
  Filled 2018-05-08: qty 1

## 2018-05-08 MED ORDER — FENTANYL CITRATE (PF) 100 MCG/2ML IJ SOLN
INTRAMUSCULAR | Status: AC
  Filled 2018-05-08: qty 2

## 2018-05-08 MED ORDER — GENTAMICIN SULFATE 40 MG/ML IJ SOLN
5.0000 mg/kg | Freq: Once | INTRAVENOUS | Status: AC
  Administered 2018-05-08: 1330 mg via INTRAVENOUS
  Filled 2018-05-08: qty 10.75

## 2018-05-08 MED ORDER — MORPHINE SULFATE (PF) 0.5 MG/ML IJ SOLN
INTRAMUSCULAR | Status: AC
  Filled 2018-05-08: qty 10

## 2018-05-08 MED ORDER — SIMETHICONE 80 MG PO CHEW
80.0000 mg | CHEWABLE_TABLET | Freq: Three times a day (TID) | ORAL | Status: DC
  Administered 2018-05-08 – 2018-05-14 (×18): 80 mg via ORAL
  Filled 2018-05-08 (×24): qty 1

## 2018-05-08 MED ORDER — SIMETHICONE 80 MG PO CHEW
80.0000 mg | CHEWABLE_TABLET | ORAL | Status: DC
  Administered 2018-05-09 – 2018-05-14 (×6): 80 mg via ORAL
  Filled 2018-05-08 (×8): qty 1

## 2018-05-08 MED ORDER — DIBUCAINE 1 % RE OINT: 1.0000 "application " | TOPICAL_OINTMENT | RECTAL | Status: DC | PRN

## 2018-05-08 MED ORDER — ONDANSETRON HCL 4 MG/2ML IJ SOLN
INTRAMUSCULAR | Status: DC | PRN
  Administered 2018-05-08: 4 mg via INTRAVENOUS

## 2018-05-08 MED ORDER — SODIUM CHLORIDE 0.9 % IR SOLN
Status: DC | PRN
  Administered 2018-05-08: 1000 mL

## 2018-05-08 MED ORDER — NALOXONE HCL 4 MG/10ML IJ SOLN
1.0000 ug/kg/h | INTRAVENOUS | Status: DC | PRN
  Filled 2018-05-08: qty 5

## 2018-05-08 MED ORDER — DIPHENHYDRAMINE HCL 50 MG/ML IJ SOLN: 12.5000 mg | INTRAMUSCULAR | Status: DC | PRN

## 2018-05-08 MED ORDER — DEXAMETHASONE SODIUM PHOSPHATE 4 MG/ML IJ SOLN
INTRAMUSCULAR | Status: DC | PRN
  Administered 2018-05-08: 4 mg via INTRAVENOUS

## 2018-05-08 MED ORDER — OXYTOCIN 10 UNIT/ML IJ SOLN
INTRAMUSCULAR | Status: AC
  Filled 2018-05-08: qty 4

## 2018-05-08 MED ORDER — SENNOSIDES-DOCUSATE SODIUM 8.6-50 MG PO TABS
2.0000 | ORAL_TABLET | ORAL | Status: DC
  Administered 2018-05-09 – 2018-05-14 (×6): 2 via ORAL
  Filled 2018-05-08 (×8): qty 2

## 2018-05-08 MED ORDER — DEXAMETHASONE SODIUM PHOSPHATE 4 MG/ML IJ SOLN
INTRAMUSCULAR | Status: AC
  Filled 2018-05-08: qty 1

## 2018-05-08 MED ORDER — PHENYLEPHRINE 8 MG IN D5W 100 ML (0.08MG/ML) PREMIX OPTIME
INJECTION | INTRAVENOUS | Status: DC | PRN
  Administered 2018-05-08: 60 ug/min via INTRAVENOUS

## 2018-05-08 MED ORDER — ZOLPIDEM TARTRATE 5 MG PO TABS: 5.0000 mg | ORAL_TABLET | Freq: Every evening | ORAL | Status: DC | PRN

## 2018-05-08 MED ORDER — ENOXAPARIN SODIUM 40 MG/0.4ML ~~LOC~~ SOLN
40.0000 mg | SUBCUTANEOUS | Status: DC
  Administered 2018-05-09 – 2018-05-14 (×6): 40 mg via SUBCUTANEOUS
  Filled 2018-05-08 (×6): qty 0.4

## 2018-05-08 MED ORDER — METOCLOPRAMIDE HCL 5 MG/ML IJ SOLN
INTRAMUSCULAR | Status: DC | PRN
  Administered 2018-05-08: 10 mg via INTRAVENOUS

## 2018-05-08 MED ORDER — LACTATED RINGERS IV SOLN
INTRAVENOUS | Status: DC
  Administered 2018-05-08 – 2018-05-09 (×2): via INTRAVENOUS

## 2018-05-08 MED ORDER — ONDANSETRON HCL 4 MG/2ML IJ SOLN
INTRAMUSCULAR | Status: AC
  Filled 2018-05-08: qty 2

## 2018-05-08 MED ORDER — ONDANSETRON HCL 4 MG/2ML IJ SOLN: 4.0000 mg | Freq: Three times a day (TID) | INTRAMUSCULAR | Status: DC | PRN

## 2018-05-08 MED ORDER — METOCLOPRAMIDE HCL 5 MG/ML IJ SOLN: 10.0000 mg | Freq: Once | INTRAMUSCULAR | Status: DC | PRN

## 2018-05-08 MED ORDER — MENTHOL 3 MG MT LOZG: 1.0000 | LOZENGE | OROMUCOSAL | Status: DC | PRN

## 2018-05-08 MED ORDER — MEPERIDINE HCL 25 MG/ML IJ SOLN: 6.2500 mg | INTRAMUSCULAR | Status: DC | PRN

## 2018-05-08 MED ORDER — DIPHENHYDRAMINE HCL 25 MG PO CAPS
25.0000 mg | ORAL_CAPSULE | ORAL | Status: DC | PRN
  Filled 2018-05-08: qty 1

## 2018-05-08 SURGICAL SUPPLY — 35 items
CLAMP CORD UMBIL (MISCELLANEOUS) ×3 IMPLANT
CLIP FILSHIE TUBAL LIGA STRL (Clip) ×3 IMPLANT
CLOTH BEACON ORANGE TIMEOUT ST (SAFETY) ×3 IMPLANT
DERMABOND ADVANCED (GAUZE/BANDAGES/DRESSINGS) ×2
DERMABOND ADVANCED .7 DNX12 (GAUZE/BANDAGES/DRESSINGS) ×1 IMPLANT
DRSG OPSITE POSTOP 4X10 (GAUZE/BANDAGES/DRESSINGS) ×3 IMPLANT
ELECT REM PT RETURN 9FT ADLT (ELECTROSURGICAL) ×3
ELECTRODE REM PT RTRN 9FT ADLT (ELECTROSURGICAL) ×1 IMPLANT
GAUZE SPONGE 4X4 12PLY STRL LF (GAUZE/BANDAGES/DRESSINGS) ×6 IMPLANT
GLOVE BIOGEL PI IND STRL 7.0 (GLOVE) ×1 IMPLANT
GLOVE BIOGEL PI IND STRL 8 (GLOVE) ×1 IMPLANT
GLOVE BIOGEL PI INDICATOR 7.0 (GLOVE) ×2
GLOVE BIOGEL PI INDICATOR 8 (GLOVE) ×2
GLOVE ECLIPSE 8.0 STRL XLNG CF (GLOVE) ×3 IMPLANT
GOWN STRL REUS W/TWL LRG LVL3 (GOWN DISPOSABLE) ×6 IMPLANT
KIT ABG SYR 3ML LUER SLIP (SYRINGE) ×3 IMPLANT
NEEDLE HYPO 25X5/8 SAFETYGLIDE (NEEDLE) ×3 IMPLANT
NS IRRIG 1000ML POUR BTL (IV SOLUTION) ×3 IMPLANT
PACK C SECTION WH (CUSTOM PROCEDURE TRAY) ×3 IMPLANT
PAD ABD 7.5X8 STRL (GAUZE/BANDAGES/DRESSINGS) ×3 IMPLANT
PAD OB MATERNITY 4.3X12.25 (PERSONAL CARE ITEMS) ×3 IMPLANT
PENCIL SMOKE EVAC W/HOLSTER (ELECTROSURGICAL) ×3 IMPLANT
SUT CHROMIC 0 CT 1 (SUTURE) ×3 IMPLANT
SUT MNCRL 0 VIOLET CTX 36 (SUTURE) ×2 IMPLANT
SUT MONOCRYL 0 CTX 36 (SUTURE) ×4
SUT PLAIN 2 0 (SUTURE) ×4
SUT PLAIN 2 0 XLH (SUTURE) IMPLANT
SUT PLAIN ABS 2-0 CT1 27XMFL (SUTURE) ×2 IMPLANT
SUT VIC AB 0 CTX 36 (SUTURE) ×2
SUT VIC AB 0 CTX36XBRD ANBCTRL (SUTURE) ×1 IMPLANT
SUT VIC AB 4-0 KS 27 (SUTURE) ×3 IMPLANT
SYR 20CC LL (SYRINGE) ×6 IMPLANT
TAPE CLOTH SURG 4X10 WHT LF (GAUZE/BANDAGES/DRESSINGS) ×3 IMPLANT
TOWEL OR 17X24 6PK STRL BLUE (TOWEL DISPOSABLE) ×3 IMPLANT
TRAY FOLEY W/BAG SLVR 14FR LF (SET/KITS/TRAYS/PACK) ×3 IMPLANT

## 2018-05-08 NOTE — Lactation Note (Signed)
This note was copied from a baby's chart. Lactation Consultation Note  Patient Name: Kristina Strickland [id number] Date: 05/08/2018 Reason for consult: Initial assessment;Early term 37-38.6wks;Difficult latch  Initial visit with P3 mom, baby is 5 hours old at this time. Mom reports able to latch infant but baby is "off and on" during feedings.   Infant stirring slightly in bassinet and offered to assist with latch. Attempted to latch infant to right breast in football hold position. Infant able to latch but not able to sustain. Demonstrated to mom how to support her baby and her breast during feedings. Encouraged STS and feeding with cues. Upon inspection of infant's mouth/suck, infant able to suck on LC's gloved finger but possible movement restriction of lingual frenulum.  Demonstrated cross-cradle position for mom as an option. [name] at bedside and assisted with feeding.  Reviewed hand expression, breast massage, OPLC services, BFSG, and phone number on pamphlet.   Maternal Data Formula Feeding for Exclusion: No Has patient been taught Hand Expression?: Yes(demonstrated) Does the patient have breastfeeding experience prior to this delivery?: Yes  Feeding Feeding Type: Breast Fed  LATCH Score Latch: Repeated attempts needed to sustain latch, nipple held in mouth throughout feeding, stimulation needed to elicit sucking reflex.  Audible Swallowing: Spontaneous and intermittent  Type of Nipple: Everted at rest and after stimulation  Comfort (Breast/Nipple): Soft / non-tender  Hold (Positioning): Assistance needed to correctly position infant at breast and maintain latch.  LATCH Score: 8  Interventions Interventions: Breast feeding basics reviewed;Assisted with latch;Skin to skin;Breast massage;Hand express;Breast compression;Adjust position;Support pillows;Position options   Consult Status Consult Status: Follow-up Date: 05/09/18 Follow-up type: In-patient    Virgia Land 05/08/2018, 1:40 PM

## 2018-05-08 NOTE — Op Note (Signed)
Cesarean Section Procedure Note   Kristina Strickland  05/08/2018  Indications: Scheduled Proceedure/Maternal Request   Pre-operative Diagnosis: Previous cesarean section, 37wks super imposed pre-eclampsia with chronic hypertension, unwanted fertility   Post-operative Diagnosis: Same   Surgeon: Surgeon(s) and Role:    * Eure, Amaryllis Dyke, MD - Primary   Attending Attestation: I was present and scrubbed for the entire procedure.   Assistants: Gwenevere Abbot, MD, Ob Fellow  Anesthesia: spinal    Estimated Blood Loss: 235 mL   Total IV Fluids:   Urine Output: 25CC OF clear urine  Specimens: Left fallopian tube  Findings: Viable female newborn, moderate adhesions of omentum to muscle  Baby condition / location:  Couplet care / Skin to Skin  APGAR: 9, 9; weight: pending (appears small, but AGA)  .     Complications: no complications  Indications: Kristina Strickland is a 33 y.o. [phone] with an IUP [redacted]w[redacted]d presenting with scheduled repeat cesarean section, chronic HTN with superimposed preE.  The risks, benefits, complications, treatment options, and expected outcomes were discussed with the patient . The patient concurred with the proposed plan, giving informed consent. identified as Kristina Strickland and the procedure verified as C-Section Delivery.  Procedure Details: A Time Out was held and the above information confirmed.  The patient was taken back to the operative suite where spinal anesthesia was placed.  After induction of anesthesia, the patient was draped and prepped in the usual sterile manner and placed in a dorsal supine position with a leftward tilt. A transverse was made and carried down through the subcutaneous tissue to the fascia. Fascial incision was made and extended transversely. The fascia was separated from the underlying rectus tissue superiorly and inferiorly. The peritoneum was identified and entered. Peritoneal incision was extended longitudinally. The utero-vesical  peritoneal reflection was incised transversely and the bladder flap was bluntly freed from the lower uterine segment. A low transverse uterine incision was made. Delivered from cephalic presentation was an ~2600 gram Living newborn infant(s) or Female with Apgar scores of 9 at one minute and 9 at five minutes. Cord ph was sent the umbilical cord was clamped and cut cord blood was obtained for evaluation. The placenta was removed Intact and appeared normal. The uterine outline, tubes and ovaries appeared normal}. The uterine incision was closed with running locked sutures of 0-monocryl.   Hemostasis was observed.  The patient's left fallopian tube was then identified and grasped with a Babcock clamp. The tube was then followed out to the fimbria. The Babcock clamp was then used to grasp the tube approximately 4 cm from the cornual region. A 3 cm segment of the tube was then ligated with free tie of plain gut suture, transected and excised. Good hemostasis was noted. The patient's right fallopian tube was then identified, grasped with a Babcock clamp and followed out to the fimbria.  The right tube did not have an adequate avascular window, thus a Filshie clip was placed on the right fallopian tube about 3 cm from the cornual attachment, with care given to incorporate the underlying mesosalpinx.  Lavage was carried out until clear. The fascia was then reapproximated with running sutures of 0Vicryl.  The skin was closed with 4-0Vicryl.   Instrument, sponge, and needle counts were correct prior the abdominal closure and were correct at the conclusion of the case.     Disposition: PACU - hemodynamically stable.   Maternal Condition: stable    Signed: Dalayza Zambrana PhillipMD 05/08/2018 9:14 AM

## 2018-05-08 NOTE — H&P (Signed)
Kristina Strickland is a 33 y.o. female presenting for repeat Caesarean section plus BTL She has essentially no PNC, 1 visit, has CHTN with superimposed pre eclampsia. Therefore being delivered at 37 weeks, received a dose of steroids OB History    Gravida  4   Para  2   Term  2   Preterm      AB  1   Living  2     SAB      TAB      Ectopic      Multiple      Live Births  2          Past Medical History:  Diagnosis Date  . Constipation 12/15/2014  . Contraceptive management 08/11/2013  . History of chicken pox   . HPV (human papilloma virus) infection   . Hypertension   . Irregular intermenstrual bleeding 11/09/2014  . Kidney stones   . Migraines   . Pelvic pain in female 12/15/2014  . Rubella immune   . Sponge kidney   . Vaginal Pap smear, abnormal    Past Surgical History:  Procedure Laterality Date  . APPENDECTOMY    . CESAREAN SECTION     C/S x 2   Family History: family history includes Hypertension in her father, maternal grandfather, mother, and paternal grandfather; Kidney Stones in her brother; Migraines in her maternal grandmother and mother. Social History:  reports that she has been smoking cigarettes. She has been smoking about 0.25 packs per day. She has never used smokeless tobacco. She reports current drug use. She reports that she does not drink alcohol.     Maternal Diabetes: No Genetic Screening: Normal Maternal Ultrasounds/Referrals:  Fetal Ultrasounds or other Referrals:  None Maternal Substance Abuse:  No Significant Maternal Medications:  Meds include: Other:  Significant Maternal Lab Results:  Lab values include: Other: unknown Other Comments:  None  ROS History   Blood pressure (!) 152/87, pulse 89, temperature 98.4 F (36.9 C), temperature source Oral, resp. rate 18, height 5\' 3"  (1.6 m), weight 85.4 kg, last menstrual period 09/28/2017, SpO2 100 %. Exam Physical Exam  Prenatal labs: ABO, Rh: --/--/A POS, A POS Performed at  [facility], [address], [place], [place] [address]  [phone]/22 1243) Antibody: NEG (01/22 1243) Rubella: 1.23 (01/22 1243) RPR: Non Reactive (01/22 1243)  HBsAg: Negative (01/22 1243)  HIV: NON REACTIVE (01/22 1243)  GBS:     Assessment/Plan: [redacted]w[redacted]d Estimated Date of Delivery: 05/29/18  CHTN with superimposed pre eclampsia Previous C section x 2 Desires permanent sterilization No prenatal care, 1 visit   Lazaro Arms 05/08/2018, 7:12 AM

## 2018-05-08 NOTE — Anesthesia Postprocedure Evaluation (Signed)
Anesthesia Post Note  Patient: Kristina Strickland  Procedure(s) Performed: CESAREAN SECTION WITH BILATERAL TUBAL LIGATION (Bilateral Abdomen)     Patient location during evaluation: Mother Baby Anesthesia Type: Spinal Level of consciousness: awake Pain management: satisfactory to patient Vital Signs Assessment: post-procedure vital signs reviewed and stable Respiratory status: spontaneous breathing Cardiovascular status: stable Postop Assessment: spinal receding, no backache, no headache and patient able to bend at knees Anesthetic complications: no    Last Vitals:  Vitals:   05/08/18 1225 05/08/18 1325  BP: (!) 156/100 (!) 155/93  Pulse: 76 90  Resp: 18 18  Temp: 36.4 C 36.8 C  SpO2: 98% 99%    Last Pain:  Vitals:   05/08/18 1325  TempSrc: Oral  PainSc: 4    Pain Goal:                   Edison Pace

## 2018-05-08 NOTE — Transfer of Care (Signed)
Immediate Anesthesia Transfer of Care Note  Patient: Kristina Strickland  Procedure(s) Performed: CESAREAN SECTION WITH BILATERAL TUBAL LIGATION (Bilateral Abdomen)  Patient Location: PACU  Anesthesia Type:Spinal  Level of Consciousness: awake, alert  and oriented  Airway & Oxygen Therapy: Patient Spontanous Breathing and Patient connected to nasal cannula oxygen  Post-op Assessment: Report given to RN and Post -op Vital signs reviewed and stable  Post vital signs: Reviewed and stable  Last Vitals:  Vitals Value Taken Time  BP    Temp    Pulse    Resp    SpO2      Last Pain:  Vitals:   05/08/18 0625  TempSrc: Oral         Complications: No apparent anesthesia complications

## 2018-05-08 NOTE — Anesthesia Preprocedure Evaluation (Signed)
Anesthesia Evaluation  Patient identified by MRN, date of birth, ID band Patient awake    Reviewed: Allergy & Precautions, NPO status , Patient's Chart, lab work & pertinent test results  Airway Mallampati: II  TM Distance: >3 FB Neck ROM: Full    Dental no notable dental hx.    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)  ,   Endo/Other  negative endocrine ROS  Renal/GU negative Renal ROS  negative genitourinary   Musculoskeletal negative musculoskeletal ROS (+) narcotic dependent  Abdominal   Peds negative pediatric ROS (+)  Hematology negative hematology ROS (+)   Anesthesia Other Findings   Reproductive/Obstetrics (+) Pregnancy                             Anesthesia Physical Anesthesia Plan  ASA: II  Anesthesia Plan: Spinal   Post-op Pain Management:    Induction:   PONV Risk Score and Plan: 2 and Ondansetron, Scopolamine patch - Pre-op and Treatment may vary due to age or medical condition  Airway Management Planned: Natural Airway  Additional Equipment:   Intra-op Plan:   Post-operative Plan:   Informed Consent: I have reviewed the patients History and Physical, chart, labs and discussed the procedure including the risks, benefits and alternatives for the proposed anesthesia with the patient or authorized representative who has indicated his/her understanding and acceptance.     Dental advisory given  Plan Discussed with: CRNA  Anesthesia Plan Comments:         Anesthesia Quick Evaluation

## 2018-05-08 NOTE — Anesthesia Procedure Notes (Signed)
Spinal  Patient location during procedure: OR Staffing Anesthesiologist: Montez Hageman, MD Performed: anesthesiologist  Preanesthetic Checklist Completed: patient identified, site marked, surgical consent, pre-op evaluation, timeout performed, IV checked, risks and benefits discussed and monitors and equipment checked Spinal Block Patient position: sitting Prep: DuraPrep Patient monitoring: heart rate, continuous pulse ox and blood pressure Approach: midline Location: L2-3 Injection technique: single-shot Needle Needle type: Sprotte  Needle gauge: 24 G Needle length: 9 cm Additional Notes Expiration date of kit checked and confirmed. Patient tolerated procedure well, without complications.

## 2018-05-08 NOTE — Progress Notes (Signed)
This encounter was created in error - please disregard.

## 2018-05-09 ENCOUNTER — Encounter (HOSPITAL_COMMUNITY): Payer: Self-pay | Admitting: *Deleted

## 2018-05-09 DIAGNOSIS — Z3A37 37 weeks gestation of pregnancy: Secondary | ICD-10-CM

## 2018-05-09 DIAGNOSIS — O114 Pre-existing hypertension with pre-eclampsia, complicating childbirth: Secondary | ICD-10-CM

## 2018-05-09 LAB — CBC
HCT: 29.6 % — ABNORMAL LOW (ref 36.0–46.0)
Hemoglobin: 9.6 g/dL — ABNORMAL LOW (ref 12.0–15.0)
MCH: 28.3 pg (ref 26.0–34.0)
MCHC: 32.4 g/dL (ref 30.0–36.0)
MCV: 87.3 fL (ref 80.0–100.0)
PLATELETS: 209 10*3/uL (ref 150–400)
RBC: 3.39 MIL/uL — ABNORMAL LOW (ref 3.87–5.11)
RDW: 14.3 % (ref 11.5–15.5)
WBC: 15.9 10*3/uL — ABNORMAL HIGH (ref 4.0–10.5)
nRBC: 0 % (ref 0.0–0.2)

## 2018-05-09 LAB — URINE CULTURE

## 2018-05-09 MED ORDER — LABETALOL HCL 100 MG PO TABS
100.0000 mg | ORAL_TABLET | Freq: Two times a day (BID) | ORAL | Status: DC
  Administered 2018-05-09 – 2018-05-10 (×4): 100 mg via ORAL
  Filled 2018-05-09 (×4): qty 1

## 2018-05-09 MED ORDER — LORATADINE 10 MG PO TABS
10.0000 mg | ORAL_TABLET | Freq: Every day | ORAL | Status: DC
  Administered 2018-05-09 – 2018-05-14 (×6): 10 mg via ORAL
  Filled 2018-05-09 (×7): qty 1

## 2018-05-09 NOTE — Progress Notes (Signed)
Discussed with the rounding physician and CNM about patient's blood pressures. Multiple blood pressures in the 160s/90s throughout the night. Patient received Norvasc 10 mg at 0649. Patient reports no headache or vision changes. Blood pressure on assessment was 157/86. Will continue to monitor.

## 2018-05-09 NOTE — Clinical Social Work Maternal (Signed)
CLINICAL SOCIAL WORK MATERNAL/CHILD NOTE  Patient Details  Name: Kristina Strickland MRN: [id number] Date of Birth: 08/19/1985  Date:  05/09/2018  Clinical Social Worker Initiating Note:  Doxie Augenstein LCSW  Date/Time: Initiated:  05/09/18/1115     Child's Name:  Kristina Strickland    Biological Parents:  Mother, Father   Need for Interpreter:  None   Reason for Referral:  Late or No Prenatal Care , Current Substance Use/Substance Use During Pregnancy    Address:  [address] [place] [address]    Phone number:  [phone] (home)     Additional phone number: NA   Household Members/Support Persons (HM/SP):   Household Member/Support Person 1(2 older children live with [name] and [name]; Kristina Strickland 8 and Kristina Strickland 9 )   HM/SP Name Relationship DOB or Age  HM/SP -1 Kristina Strickland  [name]     HM/SP -2        HM/SP -3        HM/SP -4        HM/SP -5        HM/SP -6        HM/SP -7        HM/SP -8          Natural Supports (not living in the home):  Immediate Family, Extended Family   Professional Supports:     Employment: Part-time   Type of Work: Cleaning houses    Education:      Homebound arranged:    Financial Resources:  Self-Pay    Other Resources:      Cultural/Religious Considerations Which May Impact Care:  NA  Strengths:  Ability to meet basic needs , Home prepared for child , Compliance with medical plan , Pediatrician chosen   Psychotropic Medications:         Pediatrician:    [place]  Pediatrician List:   [place]    [place]    [place]    [place] Other([facility])  [place]    [place]      Pediatrician Fax Number:    Risk Factors/Current Problems:  None   Cognitive State:  Alert , Linear Thinking    Mood/Affect:  Calm , Happy , Bright    CSW Assessment: CSW met with [name] via bedside after receiving consult for limited PNC and patient taking vicodin during pregnancy. [name] was  present in room and [name] request he stay during conversation. [name] voiced feeling good and having an easy delivery. No current concerns for anxiety/ depression. [name] lives with [name] and her two other children; ages, 8 and 9. [name] is currently taking time off from working and [name] is only source of income. [name] informed CSW that she received PNC at [facility] and her initial visit was at 26 weeks. [name] stated she had about 4 visit- per records [name] did have an initial visit at 26 weeks however it was not labeled as 'initial  Prenatal". [name] voiced not being able to go to as many visits because she had the flu and then her other children were sick as well.  CSW questioned [name] about taking Vicodin during her pregnancy- [name] was forth coming and stated she did take Vicodin for hand pain "once or twice" however it was an old prescription that she had from another doctor. CSW informed [name] of hospital drug policy and in the event cord screen is positive department will make CPS report. [name] voiced understanding.   CSW   encouraged [name] to evaluate he emotions during postpartum and in the event she experiences anxiety/ depression to reach out to her supports or OBGYN. [name] voiced feeling comfortable with this. [name] voiced no current concerns at this time.   UDS is negative, CSW will continue to monitor cord drug screen and make CPS report if needed.   CSW Plan/Description:  No Further Intervention Required/No Barriers to Discharge, Sudden Infant Death Syndrome (SIDS) Education, Hospital Drug Screen Policy Information, CSW Will Continue to Monitor Umbilical Cord Tissue Drug Screen Results and Make Report if Warranted, Perinatal Mood and Anxiety Disorder (PMADs) Education    Kristina Strickland M Kristina Bantz, LCSW 05/09/2018, 11:53 AM  

## 2018-05-09 NOTE — Plan of Care (Signed)
Progressing appropriately. Encouraged to call for assistance as needed, and for LATCH assessment.  

## 2018-05-09 NOTE — Lactation Note (Signed)
This note was copied from a baby's chart. Lactation Consultation Note  Patient Name: Kristina Strickland [id number] Date: 05/09/2018 Reason for consult: Follow-up assessment Baby is 34 hours old.  Mom gave formula during the night.  She states baby just finished breastfeeding and he did very well.  Instructed to feed with cues and call for assist prn.  No questions or concerns.  Maternal Data    Feeding Feeding Type: Bottle Fed - Formula Nipple Type: Slow - flow  LATCH Score                   Interventions    Lactation Tools Discussed/Used     Consult Status Consult Status: Follow-up Date: 05/10/18 Follow-up type: In-patient    Huston Foley 05/09/2018, 1:28 PM

## 2018-05-09 NOTE — Progress Notes (Signed)
BP one hour after labetalol dose:   05/09/18 1630  Vital Signs  BP 136/80  BP Location Right Arm  Patient Position (if appropriate) Left side lying/tilt  BP Method Automatic  Pulse Rate 90

## 2018-05-09 NOTE — Progress Notes (Signed)
Dr. Dareen Piano notified. MD to come assess at bedside. Patient is not having any headache or vision changes.     05/09/18 1424  Vital Signs  BP (!) 173/98  BP Location Right Arm  Patient Position (if appropriate) Semi-fowlers  BP Method Automatic  Pulse Rate 86  Pulse Rate Source Dinamap

## 2018-05-09 NOTE — Progress Notes (Signed)
Post Partum Day 1  Subjective: no complaints, up ad lib, voiding, tolerating PO and + flatus She has no complaints this morning and denies headaches, vision changes, chest pain, shortness of breath, and leg swelling.  Objective: Blood pressure (!) 157/86, pulse 75, temperature 99.4 F (37.4 C), temperature source Oral, resp. rate 18, height 5\' 3"  (1.6 m), weight 85.4 kg, last menstrual period 09/28/2017, SpO2 96 %, unknown if currently breastfeeding.  Physical Exam:  General: alert, cooperative and no distress Lochia: appropriate Uterine Fundus: firm Incision: healing well, no significant drainage DVT Evaluation: No evidence of DVT seen on physical exam. No significant calf/ankle edema.  Recent Labs    05/08/18 1045 05/09/18 0630  HGB 10.9* 9.6*  HCT 33.0* 29.6*    Assessment/Plan: Breastfeeding, Social Work consult and Contraception BTL.  - Social work consult for lack of PNC (only 1 prenatal visit) - Patient has history of chronic HTN. She was having Bps 160/90s overnight and was given Amlodipine 10. Can also give Nifedipine to reduce Bps in the interim before Amlodipine takes effect. - Plan to continue to monitor Bps    LOS: 1 day   Dollene Cleveland 05/09/2018, 11:49 AM

## 2018-05-09 NOTE — Progress Notes (Signed)
Will recheck BP in 15 minutes.    05/09/18 1409  Vital Signs  BP (!) 178/103  BP Location Right Arm  Patient Position (if appropriate) Semi-fowlers  BP Method Automatic  Pulse Rate 77  Pulse Rate Source Dinamap  Resp 15  Temp 98.7 F (37.1 C)  Temp Source Oral

## 2018-05-10 ENCOUNTER — Other Ambulatory Visit: Payer: Self-pay

## 2018-05-10 MED ORDER — LISINOPRIL 20 MG PO TABS
20.0000 mg | ORAL_TABLET | Freq: Every day | ORAL | Status: DC
  Administered 2018-05-11 – 2018-05-12 (×2): 20 mg via ORAL
  Filled 2018-05-10 (×3): qty 1

## 2018-05-10 MED ORDER — LISINOPRIL 10 MG PO TABS
10.0000 mg | ORAL_TABLET | Freq: Every day | ORAL | Status: DC
  Administered 2018-05-10: 10 mg via ORAL
  Filled 2018-05-10 (×3): qty 1

## 2018-05-10 MED ORDER — POTASSIUM CHLORIDE CRYS ER 20 MEQ PO TBCR
40.0000 meq | EXTENDED_RELEASE_TABLET | Freq: Two times a day (BID) | ORAL | Status: DC
  Administered 2018-05-10 – 2018-05-11 (×3): 40 meq via ORAL
  Filled 2018-05-10 (×3): qty 2

## 2018-05-10 NOTE — Discharge Summary (Addendum)
Postpartum Discharge Summary     Patient Name: Kristina Strickland DOB: Jan 30, 1986 MRN: [id number]  Date of admission: 05/08/2018 Delivering Provider: Lazaro Arms   Date of discharge: 05/14/2018  Admitting diagnosis: 37wks super imposed pre-eclampsia with chronic hypertension Intrauterine pregnancy: [redacted]w[redacted]d     Secondary diagnosis:  Principal Problem:   Previous cesarean delivery, antepartum Active Problems:   Chronic hypertension affecting pregnancy   Late prenatal care   History of 2 cesarean sections   Unwanted fertility   Tobacco use during pregnancy  Discharge diagnosis: Term Pregnancy Delivered, CHTN and [place] with superimposed preeclampsia                                                                                                Post partum procedures:none  Augmentation: NA  Complications: None  Hospital course:  Sceduled C/S   33 y.o. yo [id number] at [redacted]w[redacted]d was admitted to the hospital 05/08/2018 for scheduled cesarean section with BTL with the following indication:Elective Repeat.  Membrane Rupture Time/Date: 8:12 AM ,05/08/2018   Patient delivered a Viable infant.05/08/2018  Details of operation can be found in separate operative note.  Pateint had a postpartum course complicated by severe preeclampsia on POD#5. She received magnesium sulfate for seizure prophylaxis.  She is ambulating, tolerating a regular diet, passing flatus, and urinating well. Patient is discharged home in stable condition on  05/14/18. Patient to follow up next week for BP and incision check         Physical exam  Vitals:   05/14/18 0000 05/14/18 0430 05/14/18 0504 05/14/18 0816  BP: 135/75  (!) 147/100 133/84  Pulse: (!) 105  89 99  Resp: 18 18 16 18   Temp: 98.1 F (36.7 C)  98 F (36.7 C) 98.5 F (36.9 C)  TempSrc: Axillary  Oral Oral  SpO2: 100%  95% 98%  Weight:      Height:       General: alert, cooperative and no distress  Lochia: appropriate Uterine Fundus: firm Incision: Dressing  is clean, dry, and intact DVT Evaluation: No evidence of DVT seen on physical exam.  Labs: Lab Results  Component Value Date   WBC 15.9 (H) 05/09/2018   HGB 9.6 (L) 05/09/2018   HCT 29.6 (L) 05/09/2018   MCV 87.3 05/09/2018   PLT 209 05/09/2018   CMP Latest Ref Rng & Units 05/08/2018  Glucose 70 - 99 mg/dL -  BUN 6 - 20 mg/dL -  Creatinine 0.01 - 7.49 mg/dL 4.49  Sodium 675 - 916 mmol/L -  Potassium 3.5 - 5.1 mmol/L -  Chloride 98 - 111 mmol/L -  CO2 22 - 32 mmol/L -  Calcium 8.9 - 10.3 mg/dL -  Total Protein 6.5 - 8.1 g/dL -  Total Bilirubin 0.3 - 1.2 mg/dL -  Alkaline Phos 38 - 384 U/L -  AST 15 - 41 U/L -  ALT 0 - 44 U/L -    Discharge instruction: per After Visit Summary and "Baby and Me Booklet".  After visit meds:  Allergies as of 05/14/2018      Reactions   Metronidazole  Rash   Penicillins Swelling, Rash   Did it involve swelling of the face/tongue/throat, SOB, or low BP? Yes Did it involve sudden or severe rash/hives, skin peeling, or any reaction on the inside of your mouth or nose? Yes Did you need to seek medical attention at a hospital or doctor's office? Yes When did it last happen?More than 10 years If all above answers are "NO", may proceed with cephalosporin use.      Medication List    STOP taking these medications   labetalol 200 MG tablet Commonly known as:  NORMODYNE   loratadine 10 MG tablet Commonly known as:  CLARITIN   omeprazole 20 MG capsule Commonly known as:  PRILOSEC     TAKE these medications   amLODipine 10 MG tablet Commonly known as:  NORVASC Take 1 tablet (10 mg total) by mouth daily for 30 days.   ibuprofen 600 MG tablet Commonly known as:  ADVIL,MOTRIN Take 1 tablet (600 mg total) by mouth every 6 (six) hours as needed for moderate pain or cramping.   ibuprofen 600 MG tablet Commonly known as:  ADVIL,MOTRIN Take 1 tablet (600 mg total) by mouth every 6 (six) hours as needed.   lisinopril 40 MG tablet Commonly  known as:  PRINIVIL,ZESTRIL Take 1 tablet (40 mg total) by mouth daily. Start taking on:  May 15, 2018   oxyCODONE-acetaminophen 5-325 MG tablet Commonly known as:  PERCOCET/ROXICET Take 1 tablet by mouth every 6 (six) hours as needed.   prenatal multivitamin Tabs tablet Take 1 tablet by mouth daily at 12 noon for 30 days. What changed:  medication strength       Diet: low salt diet  Activity: Advance as tolerated. Pelvic rest for 6 weeks.   Outpatient follow up: 1 week Follow up Appt:No future appointments. Follow up Visit:   Newborn Data: Live born female  Birth Weight: 6 lb 1.4 oz (2760 g) APGAR: 9, 9  Newborn Delivery   Birth date/time:  05/08/2018 08:13:00 Delivery type:  C-Section, Low Transverse Trial of labor:  No C-section categorization:  Repeat     Baby Feeding: Both Disposition:home with mother   05/14/2018 Catalina Antigua, MD

## 2018-05-10 NOTE — Progress Notes (Signed)
Post Partum Day 2 Subjective: no complaints, up ad lib, voiding, tolerating PO and + flatus  Blood pressures elevated; denies HA, blurry vision, N/V, upper abdominal pain  Objective: Blood pressure (!) 176/104, pulse 85, temperature 97.8 F (36.6 C), resp. rate 18, height 5\' 3"  (1.6 m), weight 85.4 kg, last menstrual period 09/28/2017, SpO2 100 %, unknown if currently breastfeeding.  Physical Exam:  General: alert, cooperative, appears stated age and no distress Lochia: appropriate Uterine Fundus: firm Incision: healing well, no significant drainage, no dehiscence, no significant erythema DVT Evaluation: No evidence of DVT seen on physical exam.  Recent Labs    05/08/18 1045 05/09/18 0630  HGB 10.9* 9.6*  HCT 33.0* 29.6*    Assessment/Plan: #cHTN w SIPE - elevated BP despite labetalol and amlodipine; will add lisinopril 10mg . Monitor today and plan to discharge tomorrow Routine postpartum/postop care   LOS: 2 days   Gwenevere Abbot 05/10/2018, 9:00 AM

## 2018-05-10 NOTE — Lactation Note (Signed)
This note was copied from a baby's chart. Lactation Consultation Note  Patient Name: Kristina Strickland Date: 05/10/2018   Baby 52 hours old.  Mother mostly formula fed last night but recently pumped 70 ml which she plans to give baby when he wakes. Mother states she has DEBP at home. Reviewed milk storage guidelines.  Feed on demand approximately 8-12 times per day.   Provided mother per request a manual pump also.      Maternal Data    Feeding    LATCH Score                   Interventions    Lactation Tools Discussed/Used     Consult Status      Kristina Strickland 05/10/2018, 12:16 PM

## 2018-05-11 NOTE — Lactation Note (Addendum)
This note was copied from a baby's chart. Lactation Consultation Note  Patient Name: Kristina Strickland Date: 05/11/2018   Mother states she has not breastfed since approx 8pm last night but has been pumping 60-70 ml per session. Feed on demand approximately 8-12 times per day.   Reviewed engorgement care and monitoring voids/stools. Mother aware of milk storage.   Baby has not fed since 0500.  Suggest feeding baby.  Mother states she will.     Maternal Data    Feeding Feeding Type: Bottle Fed - Formula  LATCH Score                   Interventions    Lactation Tools Discussed/Used     Consult Status      Hardie Pulley 05/11/2018, 8:49 AM

## 2018-05-11 NOTE — Progress Notes (Signed)
Post Partum Day 3 Subjective: no complaints, up ad lib, voiding, tolerating PO and + flatus  Denies any headaches, blurred vision, chest discomfort, lower extremity edema.   Objective: Blood pressure (!) 154/98, pulse 94, temperature 99.3 F (37.4 C), temperature source Oral, resp. rate 16, height 5\' 3"  (1.6 m), weight 85.4 kg, last menstrual period 09/28/2017, SpO2 98 %, unknown if currently breastfeeding.  Physical Exam:  General: alert and cooperative Lochia: appropriate Uterine Fundus: firm Incision: healing well  DVT Evaluation: No evidence of DVT seen on physical exam.  Recent Labs    05/08/18 1045 05/09/18 0630  HGB 10.9* 9.6*  HCT 33.0* 29.6*  Protein:CR= 0.36  Assessment/Plan: #chronic htn  -d/c labetalol -increase lisinopril from 10mg  to 20mg   -continue to reassess -consider PO hydralazine 50mg  QID    LOS: 3 days   Cristine Polio 05/11/2018, 8:56 AM

## 2018-05-12 MED ORDER — MAGNESIUM SULFATE 40 G IN LACTATED RINGERS - SIMPLE
2.0000 g/h | INTRAVENOUS | Status: AC
  Administered 2018-05-12: 2 g/h via INTRAVENOUS
  Filled 2018-05-12: qty 500

## 2018-05-12 MED ORDER — AMLODIPINE BESYLATE 10 MG PO TABS: 10.0000 mg | ORAL_TABLET | Freq: Every day | ORAL | 0 refills | Status: DC

## 2018-05-12 MED ORDER — LABETALOL HCL 5 MG/ML IV SOLN: 80.0000 mg | INTRAVENOUS | Status: DC | PRN

## 2018-05-12 MED ORDER — IBUPROFEN 600 MG PO TABS
600.0000 mg | ORAL_TABLET | Freq: Four times a day (QID) | ORAL | Status: DC
  Administered 2018-05-12 – 2018-05-14 (×8): 600 mg via ORAL
  Filled 2018-05-12 (×9): qty 1

## 2018-05-12 MED ORDER — IBUPROFEN 600 MG PO TABS: 600.0000 mg | ORAL_TABLET | Freq: Four times a day (QID) | ORAL | 0 refills | Status: DC | PRN

## 2018-05-12 MED ORDER — LISINOPRIL 20 MG PO TABS
20.0000 mg | ORAL_TABLET | Freq: Once | ORAL | Status: AC
  Administered 2018-05-12: 20 mg via ORAL
  Filled 2018-05-12: qty 1

## 2018-05-12 MED ORDER — HYDRALAZINE HCL 20 MG/ML IJ SOLN: 10.0000 mg | INTRAMUSCULAR | Status: DC | PRN

## 2018-05-12 MED ORDER — PRENATAL MULTIVITAMIN CH: 1.0000 | ORAL_TABLET | Freq: Every day | ORAL | 0 refills | Status: AC

## 2018-05-12 MED ORDER — LISINOPRIL 20 MG PO TABS: 20.0000 mg | ORAL_TABLET | Freq: Every day | ORAL | 0 refills | Status: DC

## 2018-05-12 MED ORDER — LABETALOL HCL 5 MG/ML IV SOLN
20.0000 mg | INTRAVENOUS | Status: DC | PRN
  Administered 2018-05-12: 20 mg via INTRAVENOUS
  Filled 2018-05-12: qty 4

## 2018-05-12 MED ORDER — LABETALOL HCL 5 MG/ML IV SOLN: 40.0000 mg | INTRAVENOUS | Status: DC | PRN

## 2018-05-12 MED ORDER — MAGNESIUM SULFATE BOLUS VIA INFUSION
4.0000 g | Freq: Once | INTRAVENOUS | Status: AC
  Administered 2018-05-12: 4 g via INTRAVENOUS
  Filled 2018-05-12: qty 500

## 2018-05-12 MED ORDER — LACTATED RINGERS IV SOLN
INTRAVENOUS | Status: DC
  Administered 2018-05-12 – 2018-05-13 (×2): via INTRAVENOUS

## 2018-05-12 NOTE — Progress Notes (Signed)
BP 155/102 Dr Dareen Piano aware of BP

## 2018-05-12 NOTE — Lactation Note (Signed)
This note was copied from a baby's chart. Lactation Consultation Note  Patient Name: Kristina Strickland OLMBE'M Date: 05/12/2018   Mom has no breast complaints. She says she has a pump at home & knows how to use the hand pump provided. Mom is providing formula & EBM.  Mom is on lisinopril 20mg  (L3) & amlodipine 10mg  (L3).    Lurline Hare [facility] 05/12/2018, 8:57 AM

## 2018-05-12 NOTE — Progress Notes (Signed)
BP 147/105 Pt rating pain of 8. Pain med given and Ice to incision. Dr Dareen Piano notified. See orders.

## 2018-05-12 NOTE — Progress Notes (Signed)
POD#4 Subjective: Doing well this morning. Denies headaches, blurry vision, abdominal pain. Initially was amenable to discharge home this morning but then developed severe range blood pressures.   Objective: Blood pressure (!) 157/100, pulse (!) 103, temperature 98.5 F (36.9 C), temperature source Oral, resp. rate 18, height 5\' 3"  (1.6 m), weight 85.4 kg, last menstrual period 09/28/2017, SpO2 97 %, unknown if currently breastfeeding.  Physical Exam:  General: well-appearing, NAD Lochia: appropriate Uterine Fundus: firm Incision: healing well, no significant drainage DVT Evaluation: No significant calf/ankle edema.  No results for input(s): HGB, HCT in the last 72 hours.  Assessment/Plan: Transfer to OB high risk for magnesium Continue Lisinopril and amlodipine  Likely discharge on PPD#6, as 24-hours of Mg++ will finish late tomorrow evening    LOS: 4 days   Kristina Strickland S Malaquias Lenker 05/12/2018, 10:10 PM

## 2018-05-12 NOTE — Progress Notes (Signed)
I visited the patient this evening, she was resting and relaxing in bed asleep. She was informed that due to her elevated Bps she would be started on magnesium and transferred to the 3rd floor for further care. She was very sweet and understanding of this plan. I also informed her that this meant she would be staying at least a couple more days with [place]. She was disappointed to hear this but very understanding. Her husband was in the room with her and was supportive.  The patient denied headaches, vision changes, chest pain, and shortness of breath.   Peggyann Shoals, DO [facility], PGY-1 05/12/2018 8:46 PM

## 2018-05-12 NOTE — Progress Notes (Signed)
Magnesium was started late because medication was sent to mother/baby during the patient's transfer to [facility]. Pt did not have an IV site yet so getting that started furthered the delay.  Arva Chafe, RN

## 2018-05-13 ENCOUNTER — Telehealth: Payer: Self-pay | Admitting: *Deleted

## 2018-05-13 MED ORDER — LISINOPRIL 40 MG PO TABS
40.0000 mg | ORAL_TABLET | Freq: Every day | ORAL | Status: DC
  Administered 2018-05-13 – 2018-05-14 (×2): 40 mg via ORAL
  Filled 2018-05-13 (×3): qty 1

## 2018-05-13 NOTE — Progress Notes (Signed)
Post Partum Day 5 Subjective: Patient reports feeling tired with a mild headache. She admits that she did not sleep well overnight. She denies visual changes, RUQ/epigastric pain  Objective: Blood pressure (!) 156/98, pulse 88, temperature 97.8 F (36.6 C), temperature source Oral, resp. rate 18, height 5\' 3"  (1.6 m), weight 85.4 kg, last menstrual period 09/28/2017, SpO2 96 %, unknown if currently breastfeeding.  Physical Exam:  General: alert, cooperative and no distress Lochia: appropriate Uterine Fundus: firm Incision: honeycomb dressing intact DVT Evaluation: No evidence of DVT seen on physical exam.  No results for input(s): HGB, HCT in the last 72 hours.  Assessment/Plan: Patient with elevated BP overnight now with diagnosis of CHTN with superimposed preeclampsia Magnesium sulfate for seizure prophylaxis initiated for 24 hours Continue close monitoring of BP    LOS: 5 days   Kristina Strickland 05/13/2018, 9:40 AM

## 2018-05-13 NOTE — Telephone Encounter (Signed)
LMOM for pt to call us back to schedule pp and bp appointment.  05-13-2018  AS

## 2018-05-14 ENCOUNTER — Telehealth: Payer: Self-pay | Admitting: *Deleted

## 2018-05-14 MED ORDER — OXYCODONE-ACETAMINOPHEN 5-325 MG PO TABS: 1.0000 | ORAL_TABLET | Freq: Four times a day (QID) | ORAL | 0 refills | Status: DC | PRN

## 2018-05-14 MED ORDER — OXYCODONE-ACETAMINOPHEN 5-325 MG PO TABS: 1.0000 | ORAL_TABLET | ORAL | 0 refills | Status: DC | PRN

## 2018-05-14 MED ORDER — LISINOPRIL 40 MG PO TABS: 40.0000 mg | ORAL_TABLET | Freq: Every day | ORAL | 6 refills | Status: DC

## 2018-05-14 MED ORDER — IBUPROFEN 600 MG PO TABS: 600.0000 mg | ORAL_TABLET | Freq: Four times a day (QID) | ORAL | 3 refills | Status: DC | PRN

## 2018-05-14 MED ORDER — IBUPROFEN 600 MG PO TABS: 600.0000 mg | ORAL_TABLET | Freq: Four times a day (QID) | ORAL | 3 refills | Status: AC | PRN

## 2018-05-14 MED ORDER — LISINOPRIL 40 MG PO TABS: 40.0000 mg | ORAL_TABLET | Freq: Every day | ORAL | 3 refills | Status: DC

## 2018-05-14 MED ORDER — AMLODIPINE BESYLATE 10 MG PO TABS: 10.0000 mg | ORAL_TABLET | Freq: Every day | ORAL | 3 refills | Status: AC

## 2018-05-14 MED FILL — IBUPROFEN 600 MG TABLET: 600 | 7 days supply | Qty: 30 | Fill #0

## 2018-05-14 MED FILL — AMLODIPINE BESYLATE 10 MG T: 10 | 30 days supply | Qty: 30 | Fill #0

## 2018-05-14 MED FILL — OXYCODONE-ACETAMINOPHEN 5-3: 5-325 | 3 days supply | Qty: 15 | Fill #0

## 2018-05-14 MED FILL — LISINOPRIL 40 MG TABLET: 40 | 30 days supply | Qty: 30 | Fill #0

## 2018-05-14 NOTE — Lactation Note (Signed)
This note was copied from a baby's chart. Lactation Consultation Note  Patient Name: Boy Annlee Woodcock KFEXM'D Date: 05/14/2018 Reason for consult: Follow-up assessment;Other (Comment)(per mom plans tp pump and bottle feed / milk is in )  Baby is 20 days old  Mom and baby ready for D/C.  Per mom has been pumping regularly and the amount has increased.  LC reviewed supply and demand and the importance of being consistent with pumping.  8- 10 times a day around the clock for 15 - 20 mins. Add hand expressing prior to pumping  And afterwards.  Mom aware to increase the volume the baby is receiving gradually  Sore nipple and engorgement prevention and tx.  Per mom has DEBP [place].  S/S's of mastitis reviewed.  Mother informed of post-discharge support and given phone number to the lactation department, including services for phone call assistance; out-patient appointments; and breastfeeding support group. List of other breastfeeding resources in the community given in the handout. Encouraged mother to call for problems or concerns related to breastfeeding.    Maternal Data Has patient been taught Hand Expression?: (enc prior to pumping and after pumping to enhance volume )  Feeding Feeding Type: Breast Milk with Formula added Nipple Type: Slow - flow  LATCH Score                   Interventions Interventions: Breast feeding basics reviewed  Lactation Tools Discussed/Used Tools: Pump Breast pump type: Double-Electric Breast Pump   Consult Status Consult Status: Complete Date: 05/14/18    Kathrin Greathouse 05/14/2018, 1:56 PM

## 2018-05-14 NOTE — Care Management Note (Signed)
Case Management Note  Patient Details  Name: Kristina Strickland MRN: [id number] Date of Birth: [birth year]  Subjective/Objective:      No insurance             Action/Plan: [place] letter given to patient  Expected Discharge Date:  05/14/18               Expected Discharge Plan:  Home/Self Care   Discharge planning Services  CM Consult, [place] [facility]   Status of Service:   completed   Additional Comments: CM met patient in room to discuss home needs.  Patient currently does not have insurance but has an appointment for [facility] application.  [place] letter filled out and given to patient- each medicine except narcotics and over the counter will be $3.00 each.  Explained to patient and patient verbalized understanding.  Patient chose [facility].  Dr. Elly Modena to send meds electronically to pharmacy as requested by patient.  CM phone number and [facility] outpatient pharmacy given to patient.   Yong Channel, RN 05/14/2018, 3:20 PM

## 2018-05-14 NOTE — Discharge Instructions (Signed)
Postpartum Hypertension °Postpartum hypertension is high blood pressure that remains higher than normal after childbirth. You may not realize that you have postpartum hypertension if your blood pressure is not being checked regularly. In most cases, postpartum hypertension will go away on its own, usually within a week of delivery. However, for some women, medical treatment is required to prevent serious complications, such as seizures or stroke. °What are the causes? °This condition may be caused by one or more of the following: °· Hypertension that existed before pregnancy (chronic hypertension). °· Hypertension that comes on as a result of pregnancy (gestational hypertension). °· Hypertensive disorders during pregnancy (preeclampsia) or seizures in women who have high blood pressure during pregnancy (eclampsia). °· A condition in which the liver, platelets, and red blood cells are damaged during pregnancy (HELLP syndrome). °· A condition in which the thyroid produces too much hormones (hyperthyroidism). °· Other rare problems of the nerves (neurological disorders) or blood disorders. °In some cases, the cause may not be known. °What increases the risk? °The following factors may make you more likely to develop this condition: °· Chronic hypertension. In some cases, this may not have been diagnosed before pregnancy. °· Obesity. °· Type 2 diabetes. °· Kidney disease. °· History of preeclampsia or eclampsia. °· Other medical conditions that change the level of hormones in the body (hormonal imbalance). °What are the signs or symptoms? °As with all types of hypertension, postpartum hypertension may not have any symptoms. Depending on how high your blood pressure is, you may experience: °· Headaches. These may be mild, moderate, or severe. They may also be steady, Johany Hansman, or sudden in onset (thunderclap headache). °· Changes in your ability to see (visual changes). °· Dizziness. °· Shortness of breath. °· Swelling  of your hands, feet, lower legs, or face. In some cases, you may have swelling in more than one of these locations. °· Heart palpitations or a racing heartbeat. °· Difficulty breathing while lying down. °· Decrease in the amount of urine that you pass. °Other rare signs and symptoms may include: °· Sweating more than usual. This lasts longer than a few days after delivery. °· Chest pain. °· Sudden dizziness when you get up from sitting or lying down. °· Seizures. °· Nausea or vomiting. °· Abdominal pain. °How is this diagnosed? °This condition may be diagnosed based on the results of a physical exam, blood pressure measurements, and blood and urine tests. °You may also have other tests, such as a CT scan or an MRI, to check for other problems of postpartum hypertension. °How is this treated? °If blood pressure is high enough to require treatment, your options may include: °· Medicines to reduce blood pressure (antihypertensives). Tell your health care provider if you are breastfeeding or if you plan to breastfeed. There are many antihypertensive medicines that are safe to take while breastfeeding. °· Stopping medicines that may be causing hypertension. °· Treating medical conditions that are causing hypertension. °· Treating the complications of hypertension, such as seizures, stroke, or kidney problems. °Your health care provider will also continue to monitor your blood pressure closely until it is within a safe range for you. °Follow these instructions at home: °· Take over-the-counter and prescription medicines only as told by your health care provider. °· Return to your normal activities as told by your health care provider. Ask your health care provider what activities are safe for you. °· Do not use any products that contain nicotine or tobacco, such as cigarettes and e-cigarettes. If   you need help quitting, ask your health care provider. °· Keep all follow-up visits as told by your health care provider. This  is important. °Contact a health care provider if: °· Your symptoms get worse. °· You have new symptoms, such as: °? A headache that does not get better. °? Dizziness. °? Visual changes. °Get help right away if: °· You suddenly develop swelling in your hands, ankles, or face. °· You have sudden, rapid weight gain. °· You develop difficulty breathing, chest pain, racing heartbeat, or heart palpitations. °· You develop severe pain in your abdomen. °· You have any symptoms of a stroke. "BE FAST" is an easy way to remember the main warning signs of a stroke: °? B - Balance. Signs are dizziness, sudden trouble walking, or loss of balance. °? E - Eyes. Signs are trouble seeing or a sudden change in vision. °? F - Face. Signs are sudden weakness or numbness of the face, or the face or eyelid drooping on one side. °? A - Arms. Signs are weakness or numbness in an arm. This happens suddenly and usually on one side of the body. °? S - Speech. Signs are sudden trouble speaking, slurred speech, or trouble understanding what people say. °? T - Time. Time to call emergency services. Write down what time symptoms started. °· You have other signs of a stroke, such as: °? A sudden, severe headache with no known cause. °? Nausea or vomiting. °? Seizure. °These symptoms may represent a serious problem that is an emergency. Do not wait to see if the symptoms will go away. Get medical help right away. Call your local emergency services (911 in the [place] Do not drive yourself to the hospital. °Summary °· Postpartum hypertension is high blood pressure that remains higher than normal after childbirth. °· In most cases, postpartum hypertension will go away on its own, usually within a week of delivery. °· For some women, medical treatment is required to prevent serious complications, such as seizures or stroke. °This information is not intended to replace advice given to you by your health care provider. Make sure you discuss any questions  you have with your health care provider. °Document Released: 12/04/2013 Document Revised: 01/21/2017 Document Reviewed: 01/21/2017 °Elsevier Interactive Patient Education © 2019 [employer] °Cesarean Delivery, Care After °This sheet gives you information about how to care for yourself after your procedure. Your health care provider may also give you more specific instructions. If you have problems or questions, contact your health care provider. °What can I expect after the procedure? °After the procedure, it is common to have: °· A small amount of blood or clear fluid coming from the incision. °· Some redness, swelling, and pain in your incision area. °· Some abdominal pain and soreness. °· Vaginal bleeding (lochia). Even though you did not have a vaginal delivery, you will still have vaginal bleeding and discharge. °· Pelvic cramps. °· Fatigue. °You may have pain, swelling, and discomfort in the tissue between your vagina and your anus (perineum) if: °· Your C-section was unplanned, and you were allowed to labor and push. °· An incision was made in the area (episiotomy) or the tissue tore during attempted vaginal delivery. °Follow these instructions at home: °Incision care ° °· Follow instructions from your health care provider about how to take care of your incision. Make sure you: °? Wash your hands with soap and water before you change your bandage (dressing). If soap and water are not available, use hand   sanitizer. °? If you have a dressing, change it or remove it as told by your health care provider. °? Leave stitches (sutures), skin staples, skin glue, or adhesive strips in place. These skin closures may need to stay in place for 2 weeks or longer. If adhesive strip edges start to loosen and curl up, you may trim the loose edges. Do not remove adhesive strips completely unless your health care provider tells you to do that. °· Check your incision area every day for signs of infection. Check for: °? More  redness, swelling, or pain. °? More fluid or blood. °? Warmth. °? Pus or a bad smell. °· Do not take baths, swim, or use a hot tub until your health care provider says it's okay. Ask your health care provider if you can take showers. °· When you cough or sneeze, hug a pillow. This helps with pain and decreases the chance of your incision opening up (dehiscing). Do this until your incision heals. °Medicines °· Take over-the-counter and prescription medicines only as told by your health care provider. °· If you were prescribed an antibiotic medicine, take it as told by your health care provider. Do not stop taking the antibiotic even if you start to feel better. °· Do not drive or use heavy machinery while taking prescription pain medicine. °Lifestyle °· Do not drink alcohol. This is especially important if you are breastfeeding or taking pain medicine. °· Do not use any products that contain nicotine or tobacco, such as cigarettes, e-cigarettes, and chewing tobacco. If you need help quitting, ask your health care provider. °Eating and drinking °· Drink at least 8 eight-ounce glasses of water every day unless told not to by your health care provider. If you breastfeed, you may need to drink even more water. °· Eat high-fiber foods every day. These foods may help prevent or relieve constipation. High-fiber foods include: °? Whole grain cereals and breads. °? Brown rice. °? Beans. °? Fresh fruits and vegetables. °Activity ° °· If possible, have someone help you care for your baby and help with household activities for at least a few days after you leave the hospital. °· Return to your normal activities as told by your health care provider. Ask your health care provider what activities are safe for you. °· Rest as much as possible. Try to rest or take a nap while your baby is sleeping. °· Do not lift anything that is heavier than 10 lbs (4.5 kg), or the limit that you were told, until your health care provider says that  it is safe. °· Talk with your health care provider about when you can engage in sexual activity. This may depend on your: °? Risk of infection. °? How fast you heal. °? Comfort and desire to engage in sexual activity. °General instructions °· Do not use tampons or douches until your health care provider approves. °· Wear loose, comfortable clothing and a supportive and well-fitting bra. °· Keep your perineum clean and dry. Wipe from front to back when you use the toilet. °· If you pass a blood clot, save it and call your health care provider to discuss. Do not flush blood clots down the toilet before you get instructions from your health care provider. °· Keep all follow-up visits for you and your baby as told by your health care provider. This is important. °Contact a health care provider if: °· You have: °? A fever. °? Bad-smelling vaginal discharge. °? Pus or a bad smell coming   from your incision. °? Difficulty or pain when urinating. °? A sudden increase or decrease in the frequency of your bowel movements. °? More redness, swelling, or pain around your incision. °? More fluid or blood coming from your incision. °? A rash. °? Nausea. °? Little or no interest in activities you used to enjoy. °? Questions about caring for yourself or your baby. °· Your incision feels warm to the touch. °· Your breasts turn red or become painful or hard. °· You feel unusually sad or worried. °· You vomit. °· You pass a blood clot from your vagina. °· You urinate more than usual. °· You are dizzy or light-headed. °Get help right away if: °· You have: °? Pain that does not go away or get better with medicine. °? Chest pain. °? Difficulty breathing. °? Blurred vision or spots in your vision. °? Thoughts about hurting yourself or your baby. °? New pain in your abdomen or in one of your legs. °? A severe headache. °· You faint. °· You bleed from your vagina so much that you fill more than one sanitary pad in one hour. Bleeding should  not be heavier than your heaviest period. °Summary °· After the procedure, it is common to have pain at your incision site, abdominal cramping, and slight bleeding from your vagina. °· Check your incision area every day for signs of infection. °· Tell your health care provider about any unusual symptoms. °· Keep all follow-up visits for you and your baby as told by your health care provider. °This information is not intended to replace advice given to you by your health care provider. Make sure you discuss any questions you have with your health care provider. °Document Released: 12/23/2001 Document Revised: 10/09/2017 Document Reviewed: 10/09/2017 °Elsevier Interactive Patient Education © 2019 [employer] ° °

## 2018-05-14 NOTE — Telephone Encounter (Signed)
Lmom for pt to call us back to schedule bp check and pp appointment.  05-14-2018  AS

## 2018-05-14 NOTE — Progress Notes (Signed)
Pt plans to go to [facility] to get   Prescriptions    meds   Teaching complete

## 2018-05-15 ENCOUNTER — Telehealth: Payer: Self-pay | Admitting: *Deleted

## 2018-05-15 NOTE — Telephone Encounter (Signed)
Left message for pt to call us back to set up pp appt and bp check.   05-15-2018  AS

## 2018-05-19 ENCOUNTER — Telehealth: Payer: Self-pay | Admitting: *Deleted

## 2018-05-19 NOTE — Telephone Encounter (Signed)
Left message for pt to call us back to schedule follow up appointments.  05-19-2018  AS

## 2018-05-20 ENCOUNTER — Telehealth: Payer: Self-pay | Admitting: *Deleted

## 2018-05-20 ENCOUNTER — Encounter: Payer: Self-pay | Admitting: *Deleted

## 2018-05-20 NOTE — Telephone Encounter (Signed)
Multiple attempts and voice mails left to reach pt by phone with no success.  Mailed letter out to follow up.  05-20-2018  AS

## 2018-05-22 ENCOUNTER — Encounter: Payer: Self-pay | Admitting: Women's Health

## 2018-10-09 ENCOUNTER — Other Ambulatory Visit: Payer: Self-pay | Admitting: Obstetrics and Gynecology

## 2019-04-17 DIAGNOSIS — I219 Acute myocardial infarction, unspecified: Secondary | ICD-10-CM

## 2019-04-17 DIAGNOSIS — R609 Edema, unspecified: Secondary | ICD-10-CM

## 2019-04-17 DIAGNOSIS — I509 Heart failure, unspecified: Secondary | ICD-10-CM

## 2019-04-17 HISTORY — DX: Heart failure, unspecified: I50.9

## 2019-04-17 HISTORY — DX: Edema, unspecified: R60.9

## 2019-04-17 HISTORY — DX: Acute myocardial infarction, unspecified: I21.9

## 2019-04-27 NOTE — Progress Notes (Deleted)
New Patient Office Visit  Assessment & Plan:  ***  Follow-up: No follow-ups on file.   Deliah Boston, MSN, APRN, FNP-C [facility]  Subjective:  Patient ID: Kristina Strickland, female    DOB: 29-May-1985  Age: 34 y.o. MRN: [id number]  Patient Care Team: Gwenlyn Fudge, FNP as PCP - General (Family Medicine)  CC: No chief complaint on file.   HPI Kristina Strickland presents to establish care. *** Patient also ***   ROS  Current Outpatient Medications:  .  amLODipine (NORVASC) 10 MG tablet, Take 1 tablet (10 mg total) by mouth daily for 30 days., Disp: 30 tablet, Rfl: 3 .  ibuprofen (ADVIL,MOTRIN) 600 MG tablet, Take 1 tablet (600 mg total) by mouth every 6 (six) hours as needed., Disp: 60 tablet, Rfl: 3 .  ibuprofen (ADVIL,MOTRIN) 600 MG tablet, Take 1 tablet (600 mg total) by mouth every 6 (six) hours as needed for moderate pain or cramping., Disp: 30 tablet, Rfl: 3 .  lisinopril (PRINIVIL,ZESTRIL) 40 MG tablet, Take 1 tablet (40 mg total) by mouth daily., Disp: 30 tablet, Rfl: 3 .  oxyCODONE-acetaminophen (PERCOCET/ROXICET) 5-325 MG tablet, Take 1 tablet by mouth every 6 (six) hours as needed., Disp: 15 tablet, Rfl: 0 .  oxyCODONE-acetaminophen (PERCOCET/ROXICET) 5-325 MG tablet, Take 1 tablet by mouth every 4 (four) hours as needed for moderate pain., Disp: 15 tablet, Rfl: 0  Allergies  Allergen Reactions  . Metronidazole Rash  . Penicillins Swelling and Rash    Did it involve swelling of the face/tongue/throat, SOB, or low BP? Yes Did it involve sudden or severe rash/hives, skin peeling, or any reaction on the inside of your mouth or nose? Yes Did you need to seek medical attention at a hospital or doctor's office? Yes When did it last happen?More than 10 years If all above answers are "NO", may proceed with cephalosporin use.     Past Medical History:  Diagnosis Date  . Constipation 12/15/2014  . Contraceptive management 08/11/2013  . History of  chicken pox   . HPV (human papilloma virus) infection   . Hypertension   . Irregular intermenstrual bleeding 11/09/2014  . Kidney stones   . Migraines   . Pelvic pain in female 12/15/2014  . Rubella immune   . Sponge kidney   . Vaginal Pap smear, abnormal     Past Surgical History:  Procedure Laterality Date  . APPENDECTOMY    . CESAREAN SECTION     C/S x 2  . CESAREAN SECTION WITH BILATERAL TUBAL LIGATION Bilateral 05/08/2018   Procedure: CESAREAN SECTION WITH BILATERAL TUBAL LIGATION;  Surgeon: Lazaro Arms, MD;  Location: [facility];  Service: Obstetrics;  Laterality: Bilateral;    Family History  Problem Relation Age of Onset  . Hypertension Mother   . Migraines Mother   . Hypertension Father   . Migraines Maternal Grandmother   . Hypertension Maternal Grandfather   . Hypertension Paternal Grandfather   . Kidney Stones Brother     Social History   Socioeconomic History  . Marital status: Married    Spouse name: Not on file  . Number of children: 2  . Years of education: Not on file  . Highest education level: Not on file  Occupational History  . Not on file  Tobacco Use  . Smoking status: Light Tobacco Smoker    Packs/day: 0.25    Types: Cigarettes  . Smokeless tobacco: Never Used  Substance and Sexual Activity  . Alcohol  use: No  . Drug use: Yes    Comment: vicodin  . Sexual activity: Yes    Birth control/protection: None  Other Topics Concern  . Not on file  Social History Narrative  . Not on file   Social Determinants of Health   Financial Resource Strain: Medium Risk  . Difficulty of Paying Living Expenses: Somewhat hard  Food Insecurity: Food Insecurity Present  . Worried About Charity fundraiser in the Last Year: Often true  . Ran Out of Food in the Last Year: Never true  Transportation Needs: No Transportation Needs  . Lack of Transportation (Medical): No  . Lack of Transportation (Non-Medical): No  Physical Activity: Inactive  .  Days of Exercise per Week: 0 days  . Minutes of Exercise per Session: 0 min  Stress: No Stress Concern Present  . Feeling of Stress : Only a little  Social Connections: Not Isolated  . Frequency of Communication with Friends and Family: More than three times a week  . Frequency of Social Gatherings with Friends and Family: Once a week  . Attends Religious Services: More than 4 times per year  . Active Member of Clubs or Organizations: Yes  . Attends Archivist Meetings: Never  . Marital Status: Married  Human resources officer Violence: Not At Risk  . Fear of Current or Ex-Partner: No  . Emotionally Abused: No  . Physically Abused: No  . Sexually Abused: No    Objective:   Today's Vitals: There were no vitals taken for this visit.  Physical Exam

## 2019-04-28 ENCOUNTER — Encounter: Payer: Self-pay | Admitting: Family Medicine

## 2019-04-28 ENCOUNTER — Ambulatory Visit: Payer: Medicaid Other | Admitting: Family Medicine

## 2019-06-19 ENCOUNTER — Ambulatory Visit: Payer: Self-pay | Admitting: Cardiology

## 2019-06-19 NOTE — Progress Notes (Deleted)
REASON FOR CONSULT: ***  No chief complaint on file.   REQUESTING PHYSICIAN:  Roderick Pee, PA [address] [place],  [place] [address]  HPI  Kristina Strickland is a 34 y.o. female who presents to the office with a chief complaint of "***." Patient's past medical history and cardiac risk factors include: ***  Patient is unaccompanied at today's office visit.  Patient is accompanied by *** at today's office visit.    ***  Review of systems positive for: Currently patient denies chest pain, shortness of breath at rest or effort related symptoms, lightheadedness, dizziness, palpitations, orthopnea, paroxysmal nocturnal dyspnea, lower extremity swelling, near syncope, syncopal events, hematochezia, hemoptysis, hematemesis, melanotic stools, no symptoms of amaurosis fugax, motor or sensory symptoms or dysphasia in the last 6 months.   History of  Denies prior history of coronary artery disease, myocardial infarction, congestive heart failure, deep venous thrombosis, pulmonary embolism, stroke, transient ischemic attack.  FUNCTIONAL STATUS: ***   ALLERGIES: Allergies  Allergen Reactions  . Metronidazole Rash  . Penicillins Swelling and Rash    Did it involve swelling of the face/tongue/throat, SOB, or low BP? Yes Did it involve sudden or severe rash/hives, skin peeling, or any reaction on the inside of your mouth or nose? Yes Did you need to seek medical attention at a hospital or doctor's office? Yes When did it last happen?More than 10 years If all above answers are "NO", may proceed with cephalosporin use.      MEDICATION LIST PRIOR TO VISIT: Current Outpatient Medications on File Prior to Visit  Medication Sig Dispense Refill  . amLODipine (NORVASC) 10 MG tablet Take 1 tablet (10 mg total) by mouth daily for 30 days. 30 tablet 3  . ibuprofen (ADVIL,MOTRIN) 600 MG tablet Take 1 tablet (600 mg total) by mouth every 6 (six) hours as needed. 60 tablet 3  .  ibuprofen (ADVIL,MOTRIN) 600 MG tablet Take 1 tablet (600 mg total) by mouth every 6 (six) hours as needed for moderate pain or cramping. 30 tablet 3  . lisinopril (PRINIVIL,ZESTRIL) 40 MG tablet Take 1 tablet (40 mg total) by mouth daily. 30 tablet 3  . oxyCODONE-acetaminophen (PERCOCET/ROXICET) 5-325 MG tablet Take 1 tablet by mouth every 6 (six) hours as needed. 15 tablet 0  . oxyCODONE-acetaminophen (PERCOCET/ROXICET) 5-325 MG tablet Take 1 tablet by mouth every 4 (four) hours as needed for moderate pain. 15 tablet 0   No current facility-administered medications on file prior to visit.    PAST MEDICAL HISTORY: Past Medical History:  Diagnosis Date  . Constipation 12/15/2014  . Contraceptive management 08/11/2013  . History of chicken pox   . HPV (human papilloma virus) infection   . Hypertension   . Irregular intermenstrual bleeding 11/09/2014  . Kidney stones   . Migraines   . Pelvic pain in female 12/15/2014  . Rubella immune   . Sponge kidney   . Vaginal Pap smear, abnormal     PAST SURGICAL HISTORY: Past Surgical History:  Procedure Laterality Date  . APPENDECTOMY    . CESAREAN SECTION     C/S x 2  . CESAREAN SECTION WITH BILATERAL TUBAL LIGATION Bilateral 05/08/2018   Procedure: CESAREAN SECTION WITH BILATERAL TUBAL LIGATION;  Surgeon: Lazaro Arms, MD;  Location: [facility];  Service: Obstetrics;  Laterality: Bilateral;    FAMILY HISTORY: The patient family history includes Hypertension in her father, maternal grandfather, mother, and paternal grandfather; Kidney Stones in her brother; Migraines in her maternal  grandmother and mother.   SOCIAL HISTORY:  The patient  reports that she has been smoking cigarettes. She has been smoking about 0.25 packs per day. She has never used smokeless tobacco. She reports current drug use. She reports that she does not drink alcohol.  14 ORGAN REVIEW OF SYSTEMS: CONSTITUTIONAL: No fever or significant weight loss EYES: No  recent significant visual change EARS, NOSE, MOUTH, THROAT: No recent significant change in hearing CARDIOVASCULAR: See discussion in subjective/HPI RESPIRATORY: See discussion in subjective/HPI GASTROINTESTINAL: No recent complaints of abdominal pain GENITOURINARY: No recent significant change in genitourinary status MUSCULOSKELETAL: No recent significant change in musculoskeletal status INTEGUMENTARY: No recent rash NEUROLOGIC: No recent significant change in motor function PSYCHIATRIC: No recent significant change in mood ENDOCRINOLOGIC: No recent significant change in endocrine status HEMATOLOGIC/LYMPHATIC: No recent significant unexpected bruising ALLERGIC/IMMUNOLOGIC: No recent unexplained allergic reaction  PHYSICAL EXAM: Vitals with BMI 05/14/2018 05/14/2018 05/14/2018  Height - - -  Weight - - -  BMI - - -  Systolic 810 175 (No Data)  Diastolic 93 84 (No Data)  Pulse 109 99 -     CONSTITUTIONAL: Well-developed and well-nourished. No acute distress.  SKIN: Skin is warm and dry. No rash noted. No cyanosis. No pallor. No jaundice HEAD: Normocephalic and atraumatic.  EYES: No scleral icterus MOUTH/THROAT: Moist oral membranes.  NECK: No JVD present. No thyromegaly noted. No carotid bruits  LYMPHATIC: No visible cervical adenopathy.  CHEST Normal respiratory effort. No intercostal retractions  LUNGS: *** No stridor. No wheezes. No rales.  CARDIOVASCULAR: *** ABDOMINAL: No apparent ascites.  EXTREMITIES: No peripheral edema  HEMATOLOGIC: No significant bruising NEUROLOGIC: Oriented to person, place, and time. Nonfocal. Normal muscle tone.  PSYCHIATRIC: Normal mood and affect. Normal behavior. Cooperative  RADIOLOGY: ***  CARDIAC DATABASE: Coronary artery bypass grafting:  Year: Surgical anatomy:  Valve surgery:  Pacemaker/ICD in situ:  EKG: ***  Echocardiogram: ***  Stress Testing:  ***  Heart Catheterization: ***  Carotid duplex: ***  Vascular  imaging: US Abdomen (AAA screening): Ultrasound arterial duplex: Ultrasound venous duplex:   Holter:   14-Day MCOT:   LABORATORY DATA: CBC Latest Ref Rng & Units 05/09/2018 05/08/2018 05/07/2018  WBC 4.0 - 10.5 K/uL 15.9(H) 15.0(H) 10.5  Hemoglobin 12.0 - 15.0 g/dL 9.6(L) 10.9(L) 12.4  Hematocrit 36.0 - 46.0 % 29.6(L) 33.0(L) 37.8  Platelets 150 - 400 K/uL 209 219 232    CMP Latest Ref Rng & Units 05/08/2018 05/07/2018 11/28/2015  Glucose 70 - 99 mg/dL - 80 92  BUN 6 - 20 mg/dL - 8 11  Creatinine 0.44 - 1.00 mg/dL 0.74 0.68 0.67  Sodium 135 - 145 mmol/L - 135 138  Potassium 3.5 - 5.1 mmol/L - 2.9(L) 4.0  Chloride 98 - 111 mmol/L - 103 100  CO2 22 - 32 mmol/L - 24 23  Calcium 8.9 - 10.3 mg/dL - 8.3(L) 9.3  Total Protein 6.5 - 8.1 g/dL - 6.4(L) 6.8  Total Bilirubin 0.3 - 1.2 mg/dL - 0.8 0.3  Alkaline Phos 38 - 126 U/L - 125 45  AST 15 - 41 U/L - 23 15  ALT 0 - 44 U/L - 17 12    Lipid Panel     Component Value Date/Time   CHOL 170 09/10/2014 1012   TRIG 81 09/10/2014 1012   HDL 73 09/10/2014 1012   CHOLHDL 2.3 09/10/2014 1012   LDLCALC 81 09/10/2014 1012   LABVLDL 16 09/10/2014 1012    No results found for: HGBA1C No components  found for: NTPROBNP Lab Results  Component Value Date   TSH 2.490 09/10/2014    FINAL MEDICATION LIST END OF ENCOUNTER: No orders of the defined types were placed in this encounter.   There are no discontinued medications.   Current Outpatient Medications:  .  amLODipine (NORVASC) 10 MG tablet, Take 1 tablet (10 mg total) by mouth daily for 30 days., Disp: 30 tablet, Rfl: 3 .  ibuprofen (ADVIL,MOTRIN) 600 MG tablet, Take 1 tablet (600 mg total) by mouth every 6 (six) hours as needed., Disp: 60 tablet, Rfl: 3 .  ibuprofen (ADVIL,MOTRIN) 600 MG tablet, Take 1 tablet (600 mg total) by mouth every 6 (six) hours as needed for moderate pain or cramping., Disp: 30 tablet, Rfl: 3 .  lisinopril (PRINIVIL,ZESTRIL) 40 MG tablet, Take 1 tablet (40 mg  total) by mouth daily., Disp: 30 tablet, Rfl: 3 .  oxyCODONE-acetaminophen (PERCOCET/ROXICET) 5-325 MG tablet, Take 1 tablet by mouth every 6 (six) hours as needed., Disp: 15 tablet, Rfl: 0 .  oxyCODONE-acetaminophen (PERCOCET/ROXICET) 5-325 MG tablet, Take 1 tablet by mouth every 4 (four) hours as needed for moderate pain., Disp: 15 tablet, Rfl: 0  IMPRESSION:  No diagnosis found.   RECOMMENDATIONS: Jenavie Stanczak is a 34 y.o. female whose past medical history and cardiac risk factors include: ***  No orders of the defined types were placed in this encounter.    --Continue cardiac medications as reconciled in final medication list. --No follow-ups on file.. Or sooner if needed. --Continue follow-up with your primary care physician regarding the management of your other chronic comorbid conditions.  Patient's questions and concerns were addressed to *** satisfaction. She voices understanding of the instructions provided during this encounter.   This note was created using a voice recognition software as a result there may be grammatical errors inadvertently enclosed that do not reflect the nature of this encounter. Every attempt is made to correct such errors.  Tessa Lerner, DO, [facility] Office: [phone]

## 2019-10-15 ENCOUNTER — Ambulatory Visit: Payer: Medicaid Other | Admitting: Cardiology

## 2019-10-15 ENCOUNTER — Other Ambulatory Visit: Payer: Self-pay

## 2019-10-15 ENCOUNTER — Encounter: Payer: Self-pay | Admitting: Cardiology

## 2019-10-15 VITALS — BP 145/109 | HR 121 | Resp 19 | Ht 63.0 in | Wt 150.0 lb

## 2019-10-15 DIAGNOSIS — I10 Essential (primary) hypertension: Secondary | ICD-10-CM | POA: Insufficient documentation

## 2019-10-15 DIAGNOSIS — I5021 Acute systolic (congestive) heart failure: Secondary | ICD-10-CM | POA: Insufficient documentation

## 2019-10-15 DIAGNOSIS — I428 Other cardiomyopathies: Secondary | ICD-10-CM | POA: Insufficient documentation

## 2019-10-15 MED ORDER — SPIRONOLACTONE 50 MG PO TABS: 25.0000 mg | ORAL_TABLET | Freq: Every day | ORAL | 3 refills | Status: AC

## 2019-10-15 NOTE — Progress Notes (Signed)
Patient referred by Pieter Partridge, PA for heart failure  Subjective:   Kristina Strickland, female    DOB: Mar 28, 1986, 34 y.o.   MRN: [id number]   Chief Complaint  Patient presents with   New Patient (Initial Visit)    Nonischemic cardiomyopathy     HPI  34 y.o. Caucasian female with new diagnosis heart failure  Extensive chart review performed.  History obtained from the patient.  Patient had her third child delivered by C-section in January 2021.  Following this, she developed severe exertional dyspnea and bilateral leg edema.  It is unclear if diagnosis of peripartum cardiomyopathy was made at that point.  It appears that she underwent cardiac catheterization at [place] that showed clear coronaries, elevated LVEDP.  I do not see any echocardiogram.  However, BNP was significantly elevated.  She never saw a cardiologist until now.  She is currently on carvedilol 12.5 mg twice daily, lisinopril 40 mg daily, along with amlodipine for hypertension.  She takes Percocet and Vicodin for migraine along with occasional ibuprofen.  She denies any daily alcohol use.  Endorses occasional marijuana use and cocaine use in the past.  She denies congestive heart failure history, although has history of CAD in the family.  Patient is very emotional and jittery throughout the visit today.  She cries hearing about her cardiac condition.  Prior to patient's visit, her parents had called our office wanting to know update about today's visit.  However, on specific questioning, patient would like me not to talk to her parents at this time.   Past Medical History:  Diagnosis Date   Constipation 12/15/2014   Contraceptive management 08/11/2013   History of chicken pox    HPV (human papilloma virus) infection    Hypertension    Irregular intermenstrual bleeding 11/09/2014   Kidney stones    Migraines    Pelvic pain in female 12/15/2014   Rubella immune    Sponge kidney     Vaginal Pap smear, abnormal      Past Surgical History:  Procedure Laterality Date   APPENDECTOMY     CESAREAN SECTION     C/S x 2   CESAREAN SECTION WITH BILATERAL TUBAL LIGATION Bilateral 05/08/2018   Procedure: CESAREAN SECTION WITH BILATERAL TUBAL LIGATION;  Surgeon: Florian Buff, MD;  Location: [facility];  Service: Obstetrics;  Laterality: Bilateral;     Social History   Tobacco Use  Smoking Status Light Tobacco Smoker   Packs/day: 0.25   Types: Cigarettes  Smokeless Tobacco Never Used    Social History   Substance and Sexual Activity  Alcohol Use No     Family History  Problem Relation Age of Onset   Hypertension Mother    Migraines Mother    Hypertension Father    Migraines Maternal Grandmother    Hypertension Maternal Grandfather    Hypertension Paternal Grandfather    Kidney Stones Brother      Current Outpatient Medications on File Prior to Visit  Medication Sig Dispense Refill   amitriptyline (ELAVIL) 25 MG tablet Take 25 mg by mouth at bedtime.     atorvastatin (LIPITOR) 40 MG tablet Take 40 mg by mouth daily.     carvedilol (COREG) 12.5 MG tablet Take 12.5 mg by mouth 2 (two) times daily with a meal.     furosemide (LASIX) 40 MG tablet Take 40 mg by mouth 2 (two) times daily.      ibuprofen (ADVIL,MOTRIN) 600 MG tablet  Take 1 tablet (600 mg total) by mouth every 6 (six) hours as needed. 60 tablet 3   lisinopril (PRINIVIL,ZESTRIL) 40 MG tablet Take 1 tablet (40 mg total) by mouth daily. 30 tablet 3   omeprazole (PRILOSEC) 40 MG capsule Take 40 mg by mouth daily.     amLODipine (NORVASC) 10 MG tablet Take 1 tablet (10 mg total) by mouth daily for 30 days. 30 tablet 3   oxyCODONE-acetaminophen (PERCOCET/ROXICET) 5-325 MG tablet Take 1 tablet by mouth every 6 (six) hours as needed. (Patient not taking: Reported on 10/15/2019) 15 tablet 0   oxyCODONE-acetaminophen (PERCOCET/ROXICET) 5-325 MG tablet Take 1 tablet by mouth every  4 (four) hours as needed for moderate pain. (Patient not taking: Reported on 10/15/2019) 15 tablet 0   No current facility-administered medications on file prior to visit.    Cardiovascular and other pertinent studies:  EKG 7/1/12021: Sinus tachycardia 120 bpm Biatrial enlargement Poor R-wave progression -nondiagnostic for this age.  Diffuse nonspecific T-abnormality.    Cardiac catheterization 04/2019: Normal coronaries LVEDP 22-26 mmHg Nonischemic cardiomyopathy  CTA 04/2019: No acute pulmonary emboli.  Cardiomegaly with mild left ventricular hypertrophy and reflux of contrast in IVC/hepatic veins suggesting heart failure.  Small bilateral pleural effusions, worse on right. Vascular congestion.  Patchy infiltrates in lung bases.  No pneumothorax.  Central tracheobronchial tree is patent.  No thoracic lymphadenopathy.      Recent labs: 09/15/2019: Glucose 101, BUN/Cr 19/0.77. EGFR >90. Na/K 139/3.7.  BNP 2134 (860 in 05/2019)  05/2019: H/H 13/40. MCV 86. Platelets 245   Review of Systems  Cardiovascular: Positive for dyspnea on exertion, leg swelling and orthopnea. Negative for chest pain, palpitations and syncope.         Vitals:   10/15/19 1527 10/15/19 1529  BP: (!) 150/113 (!) 145/109  Pulse: (!) 120 (!) 121  Resp: 19   SpO2: 100% 100%     Body mass index is 26.57 kg/m. Filed Weights   10/15/19 1527  Weight: 150 lb (68 kg)     Objective:   Physical Exam Vitals and nursing note reviewed.  Constitutional:      General: She is not in acute distress. Neck:     Vascular: JVD present.  Cardiovascular:     Rate and Rhythm: Regular rhythm. Tachycardia present.     Heart sounds: No murmur heard.  Gallop present. S3 sounds present.   Pulmonary:     Effort: Pulmonary effort is normal.     Breath sounds: Normal breath sounds. No wheezing or rales.  Musculoskeletal:     Right lower leg: Edema (3+) present.     Left lower leg: Edema (3+) present.    Psychiatric:        Mood and Affect: Mood is anxious. Affect is tearful.        Assessment & Recommendations:   34 y.o. 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 Patient is in acute decompensation.  I offered hospital admission for IV diuresis.  Which is very chance of achieving optimal euvolemic status with hold medications alone..  Patient is clearly not ready for hospitalization today and wants to continue outpatient management for now..  Started spironolactone 50 mg daily.  We will see her for follow-up next week.  She has high risk for further decompensation. Continue carvedilol 12.5 mg twice daily, lisinopril 40 mg daily.  Eventually, will need to be switched to [facility]. Counseled regarding low-salt diet, daily  weight check. Avoid ibuprofen   Thank you for referring the patient to [place]. Please feel free to contact with any questions.  Nigel Mormon, MD [facility] Pager: [phone] Office: [phone]

## 2019-10-15 NOTE — Patient Instructions (Signed)
Heart Failure Eating Plan Heart failure, also called congestive heart failure, occurs when your heart does not pump blood well enough to meet your body's needs for oxygen-rich blood. Heart failure is a long-term (chronic) condition. Living with heart failure can be challenging. However, following your health care provider's instructions about a healthy lifestyle and working with a diet and nutrition specialist (dietitian) to choose the right foods may help to improve your symptoms. What are tips for following this plan? Reading food labels  Check food labels for the amount of sodium per serving. Choose foods that have less than 140 mg (milligrams) of sodium in each serving.  Check food labels for the number of calories per serving. This is important if you need to limit your daily calorie intake to lose weight.  Check food labels for the serving size. If you eat more than one serving, you will be eating more sodium and calories than what is listed on the label.  Look for foods that are labeled as "sodium-free," "very low sodium," or "low sodium." ? Foods labeled as "reduced sodium" or "lightly salted" may still have more sodium than what is recommended for you. Cooking  Avoid adding salt when cooking. Ask your health care provider or dietitian before using salt substitutes.  Season food with salt-free seasonings, spices, or herbs. Check the label of seasoning mixes to make sure they do not contain salt.  Cook with heart-healthy oils, such as olive, canola, soybean, or sunflower oil.  Do not fry foods. Cook foods using low-fat methods, such as baking, boiling, grilling, and broiling.  Limit unhealthy fats when cooking by: ? Removing the skin from poultry, such as chicken. ? Removing all visible fats from meats. ? Skimming the fat off from stews, soups, and gravies before serving them. Meal planning   Limit your intake of: ? Processed, canned, or pre-packaged foods. ? Foods that are  high in trans fat, such as fried foods. ? Sweets, desserts, sugary drinks, and other foods with added sugar. ? Full-fat dairy products, such as whole milk.  Eat a balanced diet that includes: ? 4-5 servings of fruit each day and 4-5 servings of vegetables each day. At each meal, try to fill half of your plate with fruits and vegetables. ? Up to 6-8 servings of whole grains each day. ? Up to 2 servings of lean meat, poultry, or fish each day. One serving of meat is equal to 3 oz. This is about the same size as a deck of cards. ? 2 servings of low-fat dairy each day. ? Heart-healthy fats. Healthy fats called omega-3 fatty acids are found in foods such as flaxseed and cold-water fish like sardines, salmon, and mackerel.  Aim to eat 25-35 g (grams) of fiber a day. Foods that are high in fiber include apples, broccoli, carrots, beans, peas, and whole grains.  Do not add salt or condiments that contain salt (such as soy sauce) to foods before eating.  When eating at a restaurant, ask that your food be prepared with less salt or no salt, if possible.  Try to eat 2 or more vegetarian meals each week.  Eat more home-cooked food and eat less restaurant, buffet, and fast food. General information  Do not eat more than 2,300 mg of salt (sodium) a day. The amount of sodium that is recommended for you may be lower, depending on your condition.  Maintain a healthy body weight as directed. Ask your health care provider what a healthy weight is   for you. ? Check your weight every day. ? Work with your health care provider and dietitian to make a plan that is right for you to lose weight or maintain your current weight.  Limit how much fluid you drink. Ask your health care provider or dietitian how much fluid you can have each day.  Limit or avoid alcohol as told by your health care provider or dietitian. Recommended foods The items listed may not be a complete list. Talk with your dietitian about what  dietary choices are best for you. Fruits All fresh, frozen, and canned fruits. Dried fruits, such as raisins, prunes, and cranberries. Vegetables All fresh vegetables. Vegetables that are frozen without sauce or added salt. Low-sodium or sodium-free canned vegetables. Grains Bread with less than 80 mg of sodium per slice. Whole-wheat pasta, quinoa, and brown rice. Oats and oatmeal. Barley. Millet. Grits and cream of wheat. Whole-grain and whole-wheat cold cereal. Meats and other protein foods Lean cuts of meat. Skinless chicken and turkey. Fish with high omega-3 fatty acids, such as salmon, sardines, and other cold-water fishes. Eggs. Dried beans, peas, and edamame. Unsalted nuts and nut butters. Dairy Low-fat or nonfat (skim) milk and dried milk. Rice milk, soy milk, and almond milk. Low-fat or nonfat yogurt. Small amounts of reduced-sodium block cheese. Low-sodium cottage cheese. Fats and oils Olive, canola, soybean, flaxseed, or sunflower oil. Avocado. Sweets and desserts Apple sauce. Granola bars. Sugar-free pudding and gelatin. Frozen fruit bars. Seasoning and other foods Fresh and dried herbs. Lemon or lime juice. Vinegar. Low-sodium ketchup. Salt-free marinades, salad dressings, sauces, and seasonings. The items listed above may not be a complete list of foods and beverages you can eat. Contact a dietitian for more information. Foods to avoid The items listed may not be a complete list. Talk with your dietitian about what dietary choices are best for you. Fruits Fruits that are dried with sodium-containing preservatives. Vegetables Canned vegetables. Frozen vegetables with sauce or seasonings. Creamed vegetables. French fries. Onion rings. Pickled vegetables and sauerkraut. Grains Bread with more than 80 mg of sodium per slice. Hot or cold cereal with more than 140 mg sodium per serving. Salted pretzels and crackers. Pre-packaged breadcrumbs. Bagels, croissants, and biscuits. Meats  and other protein foods Ribs and chicken wings. Bacon, ham, pepperoni, bologna, salami, and packaged luncheon meats. Hot dogs, bratwurst, and sausage. Canned meat. Smoked meat and fish. Salted nuts and seeds. Dairy Whole milk, half-and-half, and cream. Buttermilk. Processed cheese, cheese spreads, and cheese curds. Regular cottage cheese. Feta cheese. Shredded cheese. String cheese. Fats and oils Butter, lard, shortening, ghee, and bacon fat. Canned and packaged gravies. Seasoning and other foods Onion salt, garlic salt, table salt, and sea salt. Marinades. Regular salad dressings. Relishes, pickles, and olives. Meat flavorings and tenderizers, and bouillon cubes. Horseradish, ketchup, and mustard. Worcestershire sauce. Teriyaki sauce, soy sauce (including reduced sodium). Hot sauce and Tabasco sauce. Steak sauce, fish sauce, oyster sauce, and cocktail sauce. Taco seasonings. Barbecue sauce. Tartar sauce. The items listed above may not be a complete list of foods and beverages you should avoid. Contact a dietitian for more information. Summary  A heart failure eating plan includes changes that limit your intake of sodium and unhealthy fat, and it may help you lose weight or maintain a healthy weight. Your health care provider may also recommend limiting how much fluid you drink.  Most people with heart failure should eat no more than 2,300 mg of salt (sodium) a day. The amount of sodium   that is recommended for you may be lower, depending on your condition.  Contact your health care provider or dietitian before making any major changes to your diet. This information is not intended to replace advice given to you by your health care provider. Make sure you discuss any questions you have with your health care provider. Document Revised: 05/29/2018 Document Reviewed: 08/17/2016 Elsevier Patient Education  2020 [employer]  

## 2019-10-20 ENCOUNTER — Ambulatory Visit: Payer: Medicaid Other | Admitting: Cardiology

## 2019-10-20 ENCOUNTER — Encounter: Payer: Self-pay | Admitting: Cardiology

## 2019-10-20 ENCOUNTER — Other Ambulatory Visit: Payer: Self-pay

## 2019-10-20 VITALS — BP 161/124 | HR 121 | Ht 63.25 in | Wt 147.8 lb

## 2019-10-20 DIAGNOSIS — I5021 Acute systolic (congestive) heart failure: Secondary | ICD-10-CM

## 2019-10-20 DIAGNOSIS — I5020 Unspecified systolic (congestive) heart failure: Secondary | ICD-10-CM

## 2019-10-20 DIAGNOSIS — I10 Essential (primary) hypertension: Secondary | ICD-10-CM

## 2019-10-20 DIAGNOSIS — I428 Other cardiomyopathies: Secondary | ICD-10-CM

## 2019-10-20 MED ORDER — CARVEDILOL 12.5 MG PO TABS: 12.5000 mg | ORAL_TABLET | Freq: Two times a day (BID) | ORAL | Status: DC

## 2019-10-20 MED ORDER — SPIRONOLACTONE 25 MG PO TABS: 25.0000 mg | ORAL_TABLET | Freq: Every day | ORAL | Status: DC

## 2019-10-20 MED ORDER — PANTOPRAZOLE SODIUM 20 MG PO TBEC: 40.0000 mg | DELAYED_RELEASE_TABLET | Freq: Every day | ORAL | Status: DC

## 2019-10-20 MED ORDER — AMITRIPTYLINE HCL 25 MG PO TABS
25.0000 mg | ORAL_TABLET | Freq: Every day | ORAL | Status: DC
  Filled 2019-10-20 (×367): qty 1

## 2019-10-20 MED ORDER — AMLODIPINE BESYLATE 10 MG PO TABS: 10.0000 mg | ORAL_TABLET | Freq: Every day | ORAL | Status: DC

## 2019-10-20 MED ORDER — ATORVASTATIN CALCIUM 40 MG PO TABS: 40.0000 mg | ORAL_TABLET | Freq: Every day | ORAL | Status: DC

## 2019-10-20 NOTE — Progress Notes (Addendum)
Patient referred by Pieter Partridge, PA for heart failure  Subjective:   Kristina Strickland, female    DOB: 1985/05/21, 34 y.o.   MRN: [id number]   Chief Complaint  Patient presents with  . Cardiomyopathy  . Follow-up     HPI  34 y.o. Caucasian female with hypertension, h/o pre-eclampsia, nonischemic cardiomyopathy, with acute on chronic HFrEF  Patient was diagnosed with systolic heart failure in January 2021 after birth of her third child.  It is not clear if this was deemed to be peripartum cardiomyopathy.  Patient does have history of preeclampsia with all of her 3 childbirths, and also has history of hypertension.  She does have prior use of cocaine, and currently uses marijuana on occasion.  She denies daily alcohol use.  She does use ibuprofen, along with Vicodin for headaches. Echocardiogram in January 2021 showed EF 15-20%.  Coronary angiogram showed normal coronaries.  She continues to have exertional dyspnea, orthopnea, leg edema.  Her baseline weight is around 130 pounds.  Today, she weighs 147 pounds.   Current Outpatient Medications on File Prior to Visit  Medication Sig Dispense Refill  . amitriptyline (ELAVIL) 25 MG tablet Take 25 mg by mouth at bedtime.    Marland Kitchen amLODipine (NORVASC) 10 MG tablet Take 1 tablet (10 mg total) by mouth daily for 30 days. 30 tablet 3  . atorvastatin (LIPITOR) 40 MG tablet Take 40 mg by mouth daily.    . carvedilol (COREG) 12.5 MG tablet Take 12.5 mg by mouth 2 (two) times daily with a meal.    . furosemide (LASIX) 40 MG tablet Take 40 mg by mouth 2 (two) times daily.     Marland Kitchen ibuprofen (ADVIL,MOTRIN) 600 MG tablet Take 1 tablet (600 mg total) by mouth every 6 (six) hours as needed. 60 tablet 3  . lisinopril (PRINIVIL,ZESTRIL) 40 MG tablet Take 1 tablet (40 mg total) by mouth daily. 30 tablet 3  . omeprazole (PRILOSEC) 40 MG capsule Take 40 mg by mouth daily.    Marland Kitchen spironolactone (ALDACTONE) 50 MG tablet Take 0.5 tablets (25 mg total) by mouth daily.  30 tablet 3   No current facility-administered medications on file prior to visit.    Cardiovascular and other pertinent studies:  Echocardiogram 05/06/2019: Left Ventricle: EF: 15-20%. ; GLS = -5.090% from the apical 4,3,2  chamber views respectively. Severe global Hypokinesis.  EKG 7/1/12021: Sinus tachycardia 120 bpm Biatrial enlargement Poor R-wave progression -nondiagnostic for this age.  Diffuse nonspecific T-abnormality.   Cardiac catheterization 04/2019: Normal coronaries LVEDP 22-26 mmHg Nonischemic cardiomyopathy  CTA 04/2019: No acute pulmonary emboli.  Cardiomegaly with mild left ventricular hypertrophy and reflux of contrast in IVC/hepatic veins suggesting heart failure.  Small bilateral pleural effusions, worse on right. Vascular congestion.  Patchy infiltrates in lung bases.  No pneumothorax.  Central tracheobronchial tree is patent.  No thoracic lymphadenopathy.      Recent labs: 09/15/2019: Glucose 101, BUN/Cr 19/0.77. EGFR >90. Na/K 139/3.7.  BNP 2134 (860 in 05/2019)  05/2019: H/H 13/40. MCV 86. Platelets 245   Review of Systems  Constitutional: Negative for decreased appetite, malaise/fatigue, weight gain and weight loss.  HENT: Negative for congestion.   Eyes: Negative for visual disturbance.  Cardiovascular: Positive for dyspnea on exertion, leg swelling and orthopnea. Negative for chest pain, palpitations and syncope.  Respiratory: Negative for cough.   Endocrine: Negative for cold intolerance.  Hematologic/Lymphatic: Does not bruise/bleed easily.  Skin: Negative for itching and rash.  Musculoskeletal: Negative for myalgias.  Gastrointestinal: Negative for abdominal pain, nausea and vomiting.  Genitourinary: Negative for dysuria.  Neurological: Negative for dizziness and weakness.  Psychiatric/Behavioral: The patient is not nervous/anxious.   All other systems reviewed and are negative.        Vitals:   10/20/19 1128 10/20/19 1135  BP:  (!) 157/129 (!) 161/124  Pulse: (!) 120 (!) 121  SpO2: 96% 96%     Body mass index is 25.98 kg/m. Filed Weights   10/20/19 1128  Weight: 147 lb 12.8 oz (67 kg)     Objective:   Physical Exam Vitals and nursing note reviewed.  Constitutional:      General: She is not in acute distress.    Appearance: She is well-developed.  HENT:     Head: Normocephalic and atraumatic.     Mouth/Throat:     Mouth: Mucous membranes are moist.  Eyes:     Conjunctiva/sclera: Conjunctivae normal.     Pupils: Pupils are equal, round, and reactive to light.  Neck:     Vascular: JVD present.  Cardiovascular:     Rate and Rhythm: Regular rhythm. Tachycardia present.     Pulses: Normal pulses and intact distal pulses.     Heart sounds: No murmur heard.  Gallop present. S3 sounds present.   Pulmonary:     Effort: Pulmonary effort is normal.     Breath sounds: Normal breath sounds. No wheezing or rales.  Abdominal:     General: Bowel sounds are normal.     Palpations: Abdomen is soft.     Tenderness: There is no rebound.  Musculoskeletal:        General: No tenderness. Normal range of motion.     Right lower leg: Edema (3+) present.     Left lower leg: Edema (3+) present.  Lymphadenopathy:     Cervical: No cervical adenopathy.  Skin:    General: Skin is warm and dry.  Neurological:     Mental Status: She is alert and oriented to person, place, and time.     Cranial Nerves: No cranial nerve deficit.  Psychiatric:        Mood and Affect: Mood is anxious. Affect is tearful.        Behavior: Behavior normal.        Assessment & Recommendations:   34 y.o. Caucasian female with hypertension, h/o pre-eclampsia, nonischemic cardiomyopathy, with acute on chronic HFrEF  Acute on chronic HFrEF: NYHA class III. Nonischemic cardiomyopathy, either hypertensive or peripartum cardiomyopathy. Currently on carvedilol 12.5 mg twice daily, lisinopril 40 mg daily, spironolactone 50 mg daily.    Recommend hospital admission for IV diuresis. Start IV Lasix 40 mg every 8 hours. Will hold lisinopril and switch to [facility] after gap of 36 hours. We will also add: 5 mg twice daily.   Strict I's/O. 2 g sodium restriction diet. Will obtain echocardiogram after improvement in heart rate.  Hypertension: Uncontrolled. Medications as above.   Nigel Mormon, MD [facility] Pager: [phone] Office: [phone]  Addendum: Patient chose not to get admitted to hospital on 10/21/2019 for logistical reasons.

## 2019-10-21 ENCOUNTER — Observation Stay: Admission: AD | Admit: 2019-10-21 | Payer: Medicaid Other | Source: Ambulatory Visit | Admitting: Cardiology

## 2019-10-21 ENCOUNTER — Encounter (HOSPITAL_COMMUNITY): Payer: Self-pay

## 2019-10-21 NOTE — Addendum Note (Signed)
Addended by: Elder Negus on: 10/21/2019 10:01 AM   Modules accepted: Level of Service

## 2019-10-22 ENCOUNTER — Other Ambulatory Visit: Payer: Self-pay | Admitting: Cardiology

## 2019-10-22 DIAGNOSIS — I5021 Acute systolic (congestive) heart failure: Secondary | ICD-10-CM

## 2019-10-23 ENCOUNTER — Telehealth: Payer: Self-pay

## 2019-10-27 ENCOUNTER — Other Ambulatory Visit: Payer: Self-pay

## 2019-10-27 ENCOUNTER — Ambulatory Visit: Payer: Medicaid Other | Admitting: Cardiology

## 2019-10-27 ENCOUNTER — Encounter: Payer: Self-pay | Admitting: Cardiology

## 2019-10-27 VITALS — BP 144/109 | HR 96 | Resp 18 | Wt 145.0 lb

## 2019-10-27 DIAGNOSIS — I10 Essential (primary) hypertension: Secondary | ICD-10-CM

## 2019-10-27 DIAGNOSIS — I428 Other cardiomyopathies: Secondary | ICD-10-CM

## 2019-10-27 DIAGNOSIS — I5020 Unspecified systolic (congestive) heart failure: Secondary | ICD-10-CM

## 2019-10-27 MED ORDER — SACUBITRIL-VALSARTAN 49-51 MG PO TABS: 1.0000 | ORAL_TABLET | Freq: Two times a day (BID) | ORAL | Status: AC

## 2019-10-27 MED ORDER — CARVEDILOL 25 MG PO TABS: 12.5000 mg | ORAL_TABLET | Freq: Two times a day (BID) | ORAL | 2 refills | Status: AC

## 2019-10-27 NOTE — Progress Notes (Signed)
Patient referred by Pieter Partridge, PA for heart failure  Subjective:   Kristina Strickland, female    DOB: 1986/04/08, 34 y.o.   MRN: [id number]   Chief Complaint  Patient presents with   Acute Systolic Heart Failure   Follow-up    swelling     HPI  34 y.o. Caucasian female with hypertension, h/o pre-eclampsia, nonischemic cardiomyopathy, with acute on chronic HFrEF  Patient was diagnosed with systolic heart failure in January 2021 after birth of her third child.  It is not clear if this was deemed to be peripartum cardiomyopathy.  Patient does have history of preeclampsia with all of her 3 childbirths, and also has history of hypertension.  She does have prior use of cocaine, and currently uses marijuana on occasion.  She denies daily alcohol use.  She does use ibuprofen, along with Vicodin for headaches. Echocardiogram in January 2021 showed EF 15-20%.  Coronary angiogram showed normal coronaries.  In 10/2019, I had recommended hospitalization for acute systolic heart failure. Patient did not get admitted due to personal reasons. She is here for follow up. Fortunately, she has had some improvement in her dyspnea and leg edema symptoms, has lost weight by 2 lbs.. She is compliant with her medical therapy.    Current Outpatient Medications on File Prior to Visit  Medication Sig Dispense Refill   amitriptyline (ELAVIL) 25 MG tablet Take 25 mg by mouth at bedtime.     atorvastatin (LIPITOR) 40 MG tablet Take 40 mg by mouth daily.     butalbital-acetaminophen-caffeine (FIORICET) 50-325-40 MG tablet Take 1 tablet by mouth every 4 (four) hours as needed.     carvedilol (COREG) 12.5 MG tablet Take 12.5 mg by mouth 2 (two) times daily with a meal.     furosemide (LASIX) 40 MG tablet Take 40 mg by mouth 2 (two) times daily.      ibuprofen (ADVIL,MOTRIN) 600 MG tablet Take 1 tablet (600 mg total) by mouth every 6 (six) hours as needed. 60 tablet 3   lisinopril (PRINIVIL,ZESTRIL) 40 MG  tablet Take 1 tablet (40 mg total) by mouth daily. 30 tablet 3   omeprazole (PRILOSEC) 40 MG capsule Take 40 mg by mouth daily.     spironolactone (ALDACTONE) 50 MG tablet Take 0.5 tablets (25 mg total) by mouth daily. 30 tablet 3   amLODipine (NORVASC) 10 MG tablet Take 1 tablet (10 mg total) by mouth daily for 30 days. 30 tablet 3   Current Facility-Administered Medications on File Prior to Visit  Medication Dose Route Frequency Provider Last Rate Last Admin   amitriptyline (ELAVIL) tablet 25 mg  25 mg Oral QHS Carlisle Enke J, MD       amLODipine (NORVASC) tablet 10 mg  10 mg Oral Daily Marveline Profeta J, MD       atorvastatin (LIPITOR) tablet 40 mg  40 mg Oral Daily Lelaina Oatis J, MD       carvedilol (COREG) tablet 12.5 mg  12.5 mg Oral BID WC Brittie Whisnant J, MD       pantoprazole (PROTONIX) EC tablet 40 mg  40 mg Oral Daily Caliah Kopke J, MD       spironolactone (ALDACTONE) tablet 25 mg  25 mg Oral Daily Moncerrath Berhe J, MD        Cardiovascular and other pertinent studies:  Echocardiogram 05/06/2019: Left Ventricle: EF: 15-20%. ; GLS = -5.090% from the apical 4,3,2  chamber views respectively. Severe global Hypokinesis.  EKG 7/1/12021: Sinus  tachycardia 120 bpm Biatrial enlargement Poor R-wave progression -nondiagnostic for this age.  Diffuse nonspecific T-abnormality.   Cardiac catheterization 04/2019: Normal coronaries LVEDP 22-26 mmHg Nonischemic cardiomyopathy  CTA 04/2019: No acute pulmonary emboli.  Cardiomegaly with mild left ventricular hypertrophy and reflux of contrast in IVC/hepatic veins suggesting heart failure.  Small bilateral pleural effusions, worse on right. Vascular congestion.  Patchy infiltrates in lung bases.  No pneumothorax.  Central tracheobronchial tree is patent.  No thoracic lymphadenopathy.      Recent labs: 09/15/2019: Glucose 101, BUN/Cr 19/0.77. EGFR >90. Na/K 139/3.7.  BNP 2134 (860 in  05/2019)  05/2019: H/H 13/40. MCV 86. Platelets 245   Review of Systems  Cardiovascular: Positive for dyspnea on exertion and leg swelling. Negative for chest pain, palpitations and syncope.         Vitals:   10/27/19 1525  BP: (!) 144/109  Pulse: 96  Resp: 18  SpO2: 100%     Body mass index is 25.48 kg/m. Filed Weights   10/27/19 1525  Weight: 145 lb (65.8 kg)     Objective:   Physical Exam Vitals and nursing note reviewed.  Constitutional:      General: She is not in acute distress. Neck:     Vascular: JVD present.  Cardiovascular:     Rate and Rhythm: Normal rate and regular rhythm.     Heart sounds: Normal heart sounds. No murmur heard.   Pulmonary:     Effort: Pulmonary effort is normal.     Breath sounds: Normal breath sounds. No wheezing or rales.  Musculoskeletal:     Right lower leg: Edema (2+) present.     Left lower leg: Edema (2+) present.        Assessment & Recommendations:   34 y.o. Caucasian female with hypertension, h/o pre-eclampsia, nonischemic cardiomyopathy, with acute on chronic HFrEF  Chronic HFrEF: Acute decompensation is improving. She is no longer tachycardic, although still has JVD and 2+ b/l edema. Nonischemic cardiomyopathy, either hypertensive or peripartum cardiomyopathy. NYHA class III. Increase carvedilol to 25 mg bid. Stop lisinopril. Last dose 7/13 at noon. Start Entresto 49-51 mg bid starting 7/15 morning. Check BMP, NT pro BNP, pregnancy test (although has had tubal ligation) on Monday 7/19 Continue lasix 40 mg bid, amlodipine 10 mg daily for now. Strict I's/O. 2 g sodium restriction diet. Echocardiogram on 7/23.  Hypertension: Improved control.  Nigel Mormon, MD [facility]. [place] Pager: [phone] Office: [phone]

## 2019-11-03 ENCOUNTER — Ambulatory Visit: Payer: Medicaid Other | Admitting: Cardiology

## 2019-11-06 ENCOUNTER — Ambulatory Visit: Payer: Medicaid Other | Admitting: Cardiology

## 2019-11-06 ENCOUNTER — Other Ambulatory Visit: Payer: Medicaid Other

## 2019-11-06 NOTE — Progress Notes (Deleted)
Patient referred by Pieter Partridge, PA for heart failure  Subjective:   Kristina Strickland, female    DOB: 20-May-1985, 34 y.o.   MRN: [id number]  *** No chief complaint on file.    HPI  34 y.o. Caucasian female with hypertension, h/o pre-eclampsia, nonischemic cardiomyopathy, with acute on chronic HFrEF  Patient was diagnosed with systolic heart failure in January 2021 after birth of her third child.  It is not clear if this was deemed to be peripartum cardiomyopathy.  Patient does have history of preeclampsia with all of her 3 childbirths, and also has history of hypertension.  She does have prior use of cocaine, and currently uses marijuana on occasion.  She denies daily alcohol use.  She does use ibuprofen, along with Vicodin for headaches. Echocardiogram in January 2021 showed EF 15-20%.  Coronary angiogram showed normal coronaries.  In 10/2019, I had recommended hospitalization for acute systolic heart failure. Patient did not get admitted due to personal reasons. On subsequent office f/u, her acute decompensation had improved. I stopped lisinopril and started Entresto 49-51 mg bid.  ***  Current Outpatient Medications on File Prior to Visit  Medication Sig Dispense Refill  . amitriptyline (ELAVIL) 25 MG tablet Take 25 mg by mouth at bedtime.    Marland Kitchen amLODipine (NORVASC) 10 MG tablet Take 1 tablet (10 mg total) by mouth daily for 30 days. 30 tablet 3  . atorvastatin (LIPITOR) 40 MG tablet Take 40 mg by mouth daily.    . butalbital-acetaminophen-caffeine (FIORICET) 50-325-40 MG tablet Take 1 tablet by mouth every 4 (four) hours as needed.    . carvedilol (COREG) 25 MG tablet Take 0.5 tablets (12.5 mg total) by mouth 2 (two) times daily with a meal. 60 tablet 2  . furosemide (LASIX) 40 MG tablet Take 40 mg by mouth 2 (two) times daily.     Marland Kitchen ibuprofen (ADVIL,MOTRIN) 600 MG tablet Take 1 tablet (600 mg total) by mouth every 6 (six) hours as needed. 60 tablet 3  . omeprazole (PRILOSEC) 40  MG capsule Take 40 mg by mouth daily.    Marland Kitchen spironolactone (ALDACTONE) 50 MG tablet Take 0.5 tablets (25 mg total) by mouth daily. (Patient taking differently: Take 50 mg by mouth daily. ) 30 tablet 3   Current Facility-Administered Medications on File Prior to Visit  Medication Dose Route Frequency Provider Last Rate Last Admin  . amitriptyline (ELAVIL) tablet 25 mg  25 mg Oral QHS Cleopha Indelicato J, MD      . amLODipine (NORVASC) tablet 10 mg  10 mg Oral Daily Endya Austin J, MD      . atorvastatin (LIPITOR) tablet 40 mg  40 mg Oral Daily Kaaren Nass J, MD      . carvedilol (COREG) tablet 12.5 mg  12.5 mg Oral BID WC Deane Wattenbarger J, MD      . pantoprazole (PROTONIX) EC tablet 40 mg  40 mg Oral Daily Haynes Giannotti J, MD      . sacubitril-valsartan (ENTRESTO) 49-51 mg per tablet  1 tablet Oral BID Yojan Paskett J, MD      . spironolactone (ALDACTONE) tablet 25 mg  25 mg Oral Daily Alyan Hartline J, MD        Cardiovascular and other pertinent studies:  Echocardiogram 05/06/2019: Left Ventricle: EF: 15-20%. ; GLS = -5.090% from the apical 4,3,2  chamber views respectively. Severe global Hypokinesis.  EKG 7/1/12021: Sinus tachycardia 120 bpm Biatrial enlargement Poor R-wave progression -nondiagnostic for this age.  Diffuse nonspecific T-abnormality.   Cardiac catheterization 04/2019: Normal coronaries LVEDP 22-26 mmHg Nonischemic cardiomyopathy  CTA 04/2019: No acute pulmonary emboli.  Cardiomegaly with mild left ventricular hypertrophy and reflux of contrast in IVC/hepatic veins suggesting heart failure.  Small bilateral pleural effusions, worse on right. Vascular congestion.  Patchy infiltrates in lung bases.  No pneumothorax.  Central tracheobronchial tree is patent.  No thoracic lymphadenopathy.      Recent labs: 09/15/2019: Glucose 101, BUN/Cr 19/0.77. EGFR >90. Na/K 139/3.7.  BNP 2134 (860 in 05/2019)  05/2019: H/H 13/40. MCV 86.  Platelets 245   Review of Systems  Cardiovascular: Positive for dyspnea on exertion and leg swelling. Negative for chest pain, palpitations and syncope.         There were no vitals filed for this visit.   There is no height or weight on file to calculate BMI. There were no vitals filed for this visit.   Objective:   Physical Exam Vitals and nursing note reviewed.  Constitutional:      General: She is not in acute distress. Neck:     Vascular: JVD present.  Cardiovascular:     Rate and Rhythm: Normal rate and regular rhythm.     Heart sounds: Normal heart sounds. No murmur heard.   Pulmonary:     Effort: Pulmonary effort is normal.     Breath sounds: Normal breath sounds. No wheezing or rales.  Musculoskeletal:     Right lower leg: Edema (2+) present.     Left lower leg: Edema (2+) present.        Assessment & Recommendations:   34 y.o. Caucasian female with hypertension, h/o pre-eclampsia, nonischemic cardiomyopathy, with acute on chronic HFrEF  Chronic HFrEF: Acute decompensation is improving. She is no longer tachycardic, although still has JVD and 2+ b/l edema. Nonischemic cardiomyopathy, either hypertensive or peripartum cardiomyopathy. NYHA class III. Currently on carvedilol to 25 mg bid, Entresto 49-51 mg bid, lasix 40 mg bid, amlodipine 10 mg daily. Strict I's/O. 2 g sodium restriction diet. ***  Hypertension: Improved control.  Nigel Mormon, MD [facility]. [facility] Pager: [phone] Office: [phone]

## 2019-11-11 ENCOUNTER — Other Ambulatory Visit: Payer: Medicaid Other

## 2019-11-18 ENCOUNTER — Ambulatory Visit: Payer: Medicaid Other | Admitting: Cardiology

## 2019-12-31 ENCOUNTER — Other Ambulatory Visit: Payer: Medicaid Other

## 2020-01-11 ENCOUNTER — Ambulatory Visit: Payer: Medicaid Other | Admitting: Cardiology

## 2020-01-13 ENCOUNTER — Other Ambulatory Visit: Payer: Medicaid Other

## 2020-01-18 ENCOUNTER — Ambulatory Visit: Payer: Medicaid Other | Admitting: Cardiology

## 2020-01-18 NOTE — Progress Notes (Signed)
No show

## 2023-04-01 DIAGNOSIS — Z0279 Encounter for issue of other medical certificate: Secondary | ICD-10-CM
